# Patient Record
Sex: Male | Born: 1975 | Race: Black or African American | Hispanic: No | Marital: Single | Smoking: Never smoker
Health system: Southern US, Community
[De-identification: ages and names within clinical notes are randomized; demographics above are authoritative.]

---

## 2018-04-11 ENCOUNTER — Emergency Department (HOSPITAL_COMMUNITY)
Admission: EM | Admit: 2018-04-11 | Discharge: 2018-04-11 | Disposition: A | Discharge status: Home / Self Care | Payer: Self-pay | Attending: Emergency Medicine | Admitting: Emergency Medicine

## 2018-04-11 ENCOUNTER — Encounter (HOSPITAL_COMMUNITY): Payer: Self-pay | Admitting: Emergency Medicine

## 2018-04-11 DIAGNOSIS — R42 Dizziness and giddiness: Secondary | ICD-10-CM | POA: Insufficient documentation

## 2018-04-11 DIAGNOSIS — R55 Syncope and collapse: Secondary | ICD-10-CM

## 2018-04-11 LAB — CBC
HEMATOCRIT: 44.7 % (ref 39.0–52.0)
Hemoglobin: 15.5 g/dL (ref 13.0–17.0)
MCH: 31.8 pg (ref 26.0–34.0)
MCHC: 34.7 g/dL (ref 30.0–36.0)
MCV: 91.6 fL (ref 78.0–100.0)
Platelets: 302 10*3/uL (ref 150–400)
RBC: 4.88 MIL/uL (ref 4.22–5.81)
RDW: 12.2 % (ref 11.5–15.5)
WBC: 5.9 10*3/uL (ref 4.0–10.5)

## 2018-04-11 LAB — BASIC METABOLIC PANEL
Anion gap: 8 (ref 5–15)
BUN: 14 mg/dL (ref 6–20)
CHLORIDE: 108 mmol/L (ref 98–111)
CO2: 28 mmol/L (ref 22–32)
Calcium: 9.8 mg/dL (ref 8.9–10.3)
Creatinine, Ser: 1.17 mg/dL (ref 0.61–1.24)
GFR calc Af Amer: >60 mL/min (ref >60)
GFR calc non Af Amer: >60 mL/min (ref >60)
GLUCOSE: 78 mg/dL (ref 70–99)
Potassium: 3.8 mmol/L (ref 3.5–5.1)
Sodium: 144 mmol/L (ref 135–145)

## 2018-04-11 LAB — URINALYSIS, ROUTINE W REFLEX MICROSCOPIC
Bilirubin Urine: NEGATIVE
GLUCOSE, UA: NEGATIVE mg/dL
Hgb urine dipstick: NEGATIVE
KETONES UR: NEGATIVE mg/dL
Leukocytes, UA: NEGATIVE
Nitrite: NEGATIVE
PH: 6 (ref 5.0–8.0)
Protein, ur: NEGATIVE mg/dL
Specific Gravity, Urine: 1.027 (ref 1.005–1.030)

## 2018-04-11 LAB — CBG MONITORING, ED: GLUCOSE-CAPILLARY: 79 mg/dL (ref 70–99)

## 2018-04-11 NOTE — ED Triage Notes (Signed)
Patient here from work with complaints of sudden onset of dizziness when standing. Felt nausea "like I was going to faint". Better when sitting.

## 2018-04-11 NOTE — ED Notes (Signed)
Bed: WA03 Expected date:  Expected time:  Means of arrival:  Comments: 

## 2018-04-11 NOTE — ED Provider Notes (Signed)
[facility]-EMERGENCY DEPT Provider Note   CSN: [id number] Arrival date & time: 04/11/18  1407     History   Chief Complaint Chief Complaint  Patient presents with  . Dizziness    HPI Luis Sandoval is a 42 y.o. male.  The history is provided by the patient. No language interpreter was used.     42 year old male presenting for evaluation of dizziness.  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  He is 2 weeks into the job.  This morning after staying at his job for a few hours, he felt lightheadedness and weak and request for his boss to dismiss him.  He mentions sitting down seems to improve his symptoms and his fatigue has since improved.  He describes dizziness more of some lightheadedness.  He admits he has been feeling well leading to his shift.  He has not ate all day.  He feels that he did not drink enough fluid.  He denies any recent sickness.  He did complain of some mild headache earlier but that has since improved.  He denies any hearing changes and denies any focal numbness or weakness.  No urinary symptoms  History reviewed. No pertinent past medical history.  There are no active problems to display for this patient.   History reviewed. No pertinent surgical history.      Home Medications    Prior to Admission medications   Not on File    Family History No family history on file.  Social History Social History   Tobacco Use  . Smoking status: Never Smoker  . Smokeless tobacco: Never Used  Substance Use Topics  . Alcohol use: Never    Frequency: Never  . Drug use: Never     Allergies   Patient has no allergy information on record.   Review of Systems Review of Systems  All other systems reviewed and are negative.    Physical Exam Updated Vital Signs BP (!) 147/82 (BP Location: Right Arm)   Pulse 92   Temp 97.9 F (36.6 C) (Oral)   Resp 16   SpO2 100%    Physical Exam  Constitutional: He is oriented to person, place, and time. He appears well-developed and well-nourished. No distress.  HENT:  Head: Atraumatic.  Mouth/Throat: Oropharynx is clear and moist.  Ears: TMs normal bilaterally, no evidence of cerumen impaction.  Eyes: Pupils are equal, round, and reactive to light. Conjunctivae and EOM are normal.  Neck: Normal range of motion. Neck supple.  Cardiovascular: Normal rate and regular rhythm.  Pulmonary/Chest: Effort normal and breath sounds normal.  Abdominal: Soft. Bowel sounds are normal.  Neurological: He is alert and oriented to person, place, and time. He has normal strength. No cranial nerve deficit or sensory deficit. He displays a negative Romberg sign. Coordination normal. GCS eye subscore is 4. GCS verbal subscore is 5. GCS motor subscore is 6.  Skin: No rash noted.  Psychiatric: He has a normal mood and affect.  Nursing note and vitals reviewed.    ED Treatments / Results  Labs (all labs ordered are listed, but only abnormal results are displayed) Labs Reviewed  BASIC METABOLIC PANEL  CBC  URINALYSIS, ROUTINE W REFLEX MICROSCOPIC  CBG MONITORING, ED    EKG None   Date: 04/11/2018  Rate: 63  Rhythm: normal sinus rhythm  QRS Axis: normal  Intervals: normal  ST/T Wave abnormalities: normal  Conduction Disutrbances: none  Narrative Interpretation:   Old EKG Reviewed: No significant changes noted     Radiology No results found.  Procedures Procedures (including critical care time)  Medications Ordered in ED Medications - No data to display   Initial Impression / Assessment and Plan / ED Course  I have reviewed the triage vital signs and the nursing notes.  Pertinent labs & imaging results that were available during my care of the patient were reviewed by me and considered in my medical decision making (see chart for details).     BP (!) 147/82 (BP Location: Right Arm)   Pulse 92   Temp 97.9  F (36.6 C) (Oral)   Resp 16   SpO2 100%    Final Clinical Impressions(s) / ED Diagnoses   Final diagnoses:  Near syncope    ED Discharge Orders    None     8:29 PM Patient presents complaining of dizziness/lightheadedness causing him to miss his shift today.  He is back to his baseline.  He has no focal neuro deficit on exam and is well-appearing.  Vital signs stable.  Labs are reassuring, urinalysis unremarkable, electro lites panels are reassuring, normal renal function, normal H&H and normal WBC.  EKG without concerning arrhythmia or ischemic changes.  Patient requesting for work note.  Work note provided.   Fayrene Helper, PA-C 04/11/18 2033    Pricilla Loveless, MD 04/12/18 [phone]

## 2018-04-11 NOTE — Discharge Instructions (Signed)
Please stay hydrated and rest.  Follow up with your doctor for further care.

## 2018-05-08 ENCOUNTER — Emergency Department (HOSPITAL_COMMUNITY): Payer: Self-pay

## 2018-05-08 ENCOUNTER — Inpatient Hospital Stay (HOSPITAL_COMMUNITY)
Admission: EM | Admit: 2018-05-08 | Discharge: 2018-05-09 | DRG: 040 | Disposition: A | Discharge status: Home / Self Care | Payer: Self-pay | Attending: Pulmonary Disease | Admitting: Pulmonary Disease

## 2018-05-08 DIAGNOSIS — J9601 Acute respiratory failure with hypoxia: Secondary | ICD-10-CM | POA: Insufficient documentation

## 2018-05-08 DIAGNOSIS — Z4659 Encounter for fitting and adjustment of other gastrointestinal appliance and device: Secondary | ICD-10-CM | POA: Insufficient documentation

## 2018-05-08 DIAGNOSIS — G9340 Encephalopathy, unspecified: Secondary | ICD-10-CM | POA: Diagnosis present

## 2018-05-08 DIAGNOSIS — R402433 Glasgow coma scale score 3-8, at hospital admission: Secondary | ICD-10-CM | POA: Diagnosis present

## 2018-05-08 DIAGNOSIS — Z978 Presence of other specified devices: Secondary | ICD-10-CM

## 2018-05-08 DIAGNOSIS — Z781 Physical restraint status: Secondary | ICD-10-CM

## 2018-05-08 DIAGNOSIS — E872 Acidosis, unspecified: Secondary | ICD-10-CM

## 2018-05-08 DIAGNOSIS — S80212A Abrasion, left knee, initial encounter: Secondary | ICD-10-CM | POA: Diagnosis present

## 2018-05-08 DIAGNOSIS — S61210A Laceration without foreign body of right index finger without damage to nail, initial encounter: Secondary | ICD-10-CM | POA: Diagnosis present

## 2018-05-08 DIAGNOSIS — G9341 Metabolic encephalopathy: Principal | ICD-10-CM | POA: Diagnosis present

## 2018-05-08 DIAGNOSIS — R410 Disorientation, unspecified: Secondary | ICD-10-CM

## 2018-05-08 DIAGNOSIS — S41111A Laceration without foreign body of right upper arm, initial encounter: Secondary | ICD-10-CM | POA: Diagnosis present

## 2018-05-08 DIAGNOSIS — W25XXXA Contact with sharp glass, initial encounter: Secondary | ICD-10-CM | POA: Diagnosis present

## 2018-05-08 DIAGNOSIS — F14121 Cocaine abuse with intoxication with delirium: Secondary | ICD-10-CM | POA: Diagnosis present

## 2018-05-08 DIAGNOSIS — R4689 Other symptoms and signs involving appearance and behavior: Secondary | ICD-10-CM

## 2018-05-08 DIAGNOSIS — S80211A Abrasion, right knee, initial encounter: Secondary | ICD-10-CM | POA: Diagnosis present

## 2018-05-08 DIAGNOSIS — S0181XA Laceration without foreign body of other part of head, initial encounter: Secondary | ICD-10-CM | POA: Diagnosis present

## 2018-05-08 DIAGNOSIS — E861 Hypovolemia: Secondary | ICD-10-CM | POA: Diagnosis present

## 2018-05-08 DIAGNOSIS — N179 Acute kidney failure, unspecified: Secondary | ICD-10-CM | POA: Diagnosis present

## 2018-05-08 LAB — BASIC METABOLIC PANEL
ANION GAP: 6 (ref 5–15)
ANION GAP: 9 (ref 5–15)
BUN: 23 mg/dL — AB (ref 6–20)
BUN: 23 mg/dL — AB (ref 6–20)
CHLORIDE: 108 mmol/L (ref 98–111)
CO2: 22 mmol/L (ref 22–32)
CO2: 23 mmol/L (ref 22–32)
Calcium: 8.5 mg/dL — ABNORMAL LOW (ref 8.9–10.3)
Calcium: 8.8 mg/dL — ABNORMAL LOW (ref 8.9–10.3)
Chloride: 109 mmol/L (ref 98–111)
Creatinine, Ser: 1.57 mg/dL — ABNORMAL HIGH (ref 0.61–1.24)
Creatinine, Ser: 1.8 mg/dL — ABNORMAL HIGH (ref 0.61–1.24)
GFR calc Af Amer: >60 mL/min (ref >60)
GFR calc Af Amer: 52 mL/min — ABNORMAL LOW (ref >60)
GFR calc non Af Amer: 53 mL/min — ABNORMAL LOW (ref >60)
GFR calc non Af Amer: 45 mL/min — ABNORMAL LOW (ref >60)
GLUCOSE: 113 mg/dL — AB (ref 70–99)
GLUCOSE: 94 mg/dL (ref 70–99)
POTASSIUM: 5 mmol/L (ref 3.5–5.1)
POTASSIUM: 4.6 mmol/L (ref 3.5–5.1)
SODIUM: 137 mmol/L (ref 135–145)
Sodium: 140 mmol/L (ref 135–145)

## 2018-05-08 LAB — COMPREHENSIVE METABOLIC PANEL
ALT: 19 U/L (ref 0–44)
AST: 35 U/L (ref 15–41)
Albumin: 3.7 g/dL (ref 3.5–5.0)
Alkaline Phosphatase: 43 U/L (ref 38–126)
Anion gap: 11 (ref 5–15)
BUN: 24 mg/dL — ABNORMAL HIGH (ref 6–20)
CO2: 19 mmol/L — AB (ref 22–32)
Calcium: 8.3 mg/dL — ABNORMAL LOW (ref 8.9–10.3)
Chloride: 108 mmol/L (ref 98–111)
Creatinine, Ser: 1.66 mg/dL — ABNORMAL HIGH (ref 0.61–1.24)
GFR calc Af Amer: 58 mL/min — ABNORMAL LOW (ref >60)
GFR calc non Af Amer: 50 mL/min — ABNORMAL LOW (ref >60)
Glucose, Bld: 116 mg/dL — ABNORMAL HIGH (ref 70–99)
POTASSIUM: 4.7 mmol/L (ref 3.5–5.1)
SODIUM: 138 mmol/L (ref 135–145)
Total Bilirubin: 0.9 mg/dL (ref 0.3–1.2)
Total Protein: 5.9 g/dL — ABNORMAL LOW (ref 6.5–8.1)

## 2018-05-08 LAB — I-STAT ARTERIAL BLOOD GAS, ED
ACID-BASE DEFICIT: 15 mmol/L — AB (ref 0.0–2.0)
Acid-base deficit: 5 mmol/L — ABNORMAL HIGH (ref 0.0–2.0)
BICARBONATE: 22.3 mmol/L (ref 20.0–28.0)
Bicarbonate: 14.5 mmol/L — ABNORMAL LOW (ref 20.0–28.0)
O2 SAT: 95 %
O2 SAT: 100 %
PCO2 ART: 46.6 mmHg (ref 32.0–48.0)
PH ART: 7.082 — AB (ref 7.350–7.450)
PO2 ART: 102 mmHg (ref 83.0–108.0)
PO2 ART: 271 mmHg — AB (ref 83.0–108.0)
Patient temperature: 98.9
Patient temperature: 98.4
TCO2: 16 mmol/L — AB (ref 22–32)
TCO2: 24 mmol/L (ref 22–32)
pCO2 arterial: 48.7 mmHg — ABNORMAL HIGH (ref 32.0–48.0)
pH, Arterial: 7.286 — ABNORMAL LOW (ref 7.350–7.450)

## 2018-05-08 LAB — ACETAMINOPHEN LEVEL

## 2018-05-08 LAB — CBC WITH DIFFERENTIAL/PLATELET
Abs Immature Granulocytes: 0.3 10*3/uL — ABNORMAL HIGH (ref 0.0–0.1)
Basophils Absolute: 0.1 10*3/uL (ref 0.0–0.1)
Basophils Relative: 1 %
Eosinophils Absolute: 0 10*3/uL (ref 0.0–0.7)
Eosinophils Relative: 0 %
HCT: 45.6 % (ref 39.0–52.0)
HEMOGLOBIN: 15 g/dL (ref 13.0–17.0)
Immature Granulocytes: 2 %
LYMPHS PCT: 4 %
Lymphs Abs: 0.6 10*3/uL — ABNORMAL LOW (ref 0.7–4.0)
MCH: 31.1 pg (ref 26.0–34.0)
MCHC: 32.9 g/dL (ref 30.0–36.0)
MCV: 94.4 fL (ref 78.0–100.0)
MONOS PCT: 7 %
Monocytes Absolute: 1.2 10*3/uL — ABNORMAL HIGH (ref 0.1–1.0)
Neutro Abs: 14.2 10*3/uL — ABNORMAL HIGH (ref 1.7–7.7)
Neutrophils Relative %: 86 %
Platelets: 237 10*3/uL (ref 150–400)
RBC: 4.83 MIL/uL (ref 4.22–5.81)
RDW: 11.8 % (ref 11.5–15.5)
WBC: 16.4 10*3/uL — ABNORMAL HIGH (ref 4.0–10.5)

## 2018-05-08 LAB — URINALYSIS, COMPLETE (UACMP) WITH MICROSCOPIC
BILIRUBIN URINE: NEGATIVE
GLUCOSE, UA: NEGATIVE mg/dL
Ketones, ur: 20 mg/dL — AB
Leukocytes, UA: NEGATIVE
Nitrite: NEGATIVE
Protein, ur: 30 mg/dL — AB
SPECIFIC GRAVITY, URINE: 1.013 (ref 1.005–1.030)
pH: 5 (ref 5.0–8.0)

## 2018-05-08 LAB — RAPID URINE DRUG SCREEN, HOSP PERFORMED
Amphetamines: NOT DETECTED
BARBITURATES: NOT DETECTED
Benzodiazepines: POSITIVE — AB
Cocaine: POSITIVE — AB
OPIATES: NOT DETECTED
TETRAHYDROCANNABINOL: POSITIVE — AB

## 2018-05-08 LAB — PROTIME-INR
INR: 1.2
PROTHROMBIN TIME: 15.1 s (ref 11.4–15.2)

## 2018-05-08 LAB — CK: CK TOTAL: 443 U/L — AB (ref 49–397)

## 2018-05-08 LAB — MRSA PCR SCREENING: MRSA by PCR: NEGATIVE

## 2018-05-08 LAB — HIV ANTIBODY (ROUTINE TESTING W REFLEX): HIV Screen 4th Generation wRfx: NONREACTIVE

## 2018-05-08 LAB — AMMONIA: Ammonia: 53 umol/L — ABNORMAL HIGH (ref 9–35)

## 2018-05-08 LAB — SALICYLATE LEVEL

## 2018-05-08 LAB — OSMOLALITY: Osmolality: 295 mOsm/kg (ref 275–295)

## 2018-05-08 LAB — ETHANOL

## 2018-05-08 LAB — GLUCOSE, CAPILLARY: Glucose-Capillary: 101 mg/dL — ABNORMAL HIGH (ref 70–99)

## 2018-05-08 MED ORDER — SODIUM CHLORIDE 0.9 % IV SOLN
INTRAVENOUS | Status: DC
  Administered 2018-05-09: 05:00:00 via INTRAVENOUS

## 2018-05-08 MED ORDER — SODIUM CHLORIDE 0.9 % IV BOLUS: 1000.0000 mL | Freq: Once | INTRAVENOUS | Status: DC

## 2018-05-08 MED ORDER — LIDOCAINE HCL (PF) 1 % IJ SOLN
5.0000 mL | Freq: Once | INTRAMUSCULAR | Status: AC
  Administered 2018-05-08: 5 mL
  Filled 2018-05-08: qty 5

## 2018-05-08 MED ORDER — FENTANYL CITRATE (PF) 100 MCG/2ML IJ SOLN
INTRAMUSCULAR | Status: AC
  Filled 2018-05-08: qty 2

## 2018-05-08 MED ORDER — FAMOTIDINE IN NACL 20-0.9 MG/50ML-% IV SOLN
20.0000 mg | Freq: Two times a day (BID) | INTRAVENOUS | Status: DC
  Administered 2018-05-08 (×2): 20 mg via INTRAVENOUS
  Filled 2018-05-08 (×3): qty 50

## 2018-05-08 MED ORDER — FENTANYL CITRATE (PF) 100 MCG/2ML IJ SOLN
50.0000 ug | Freq: Once | INTRAMUSCULAR | Status: AC
  Administered 2018-05-08: 50 ug via INTRAVENOUS
  Filled 2018-05-08: qty 2

## 2018-05-08 MED ORDER — SODIUM CHLORIDE 0.9 % IV SOLN
0.5000 mg/h | INTRAVENOUS | Status: DC
  Administered 2018-05-08: 0.5 mg/h via INTRAVENOUS
  Filled 2018-05-08: qty 10

## 2018-05-08 MED ORDER — SODIUM CHLORIDE 0.9 % IV BOLUS
1000.0000 mL | Freq: Once | INTRAVENOUS | Status: AC
  Administered 2018-05-08: 1000 mL via INTRAVENOUS

## 2018-05-08 MED ORDER — FENTANYL BOLUS VIA INFUSION
50.0000 ug | INTRAVENOUS | Status: DC | PRN
  Filled 2018-05-08: qty 50

## 2018-05-08 MED ORDER — STERILE WATER FOR INJECTION IV SOLN
INTRAVENOUS | Status: DC
  Administered 2018-05-08: 05:00:00 via INTRAVENOUS
  Filled 2018-05-08: qty 850

## 2018-05-08 MED ORDER — HEPARIN SODIUM (PORCINE) 5000 UNIT/ML IJ SOLN
5000.0000 [IU] | Freq: Three times a day (TID) | INTRAMUSCULAR | Status: DC
  Administered 2018-05-08 – 2018-05-09 (×4): 5000 [IU] via SUBCUTANEOUS
  Filled 2018-05-08 (×4): qty 1

## 2018-05-08 MED ORDER — ROCURONIUM BROMIDE 50 MG/5ML IV SOLN
70.0000 mg | Freq: Once | INTRAVENOUS | Status: AC
  Administered 2018-05-08: 70 mg via INTRAVENOUS

## 2018-05-08 MED ORDER — MIDAZOLAM HCL 2 MG/2ML IJ SOLN
INTRAMUSCULAR | Status: AC
  Filled 2018-05-08: qty 2

## 2018-05-08 MED ORDER — ETOMIDATE 2 MG/ML IV SOLN
20.0000 mg | Freq: Once | INTRAVENOUS | Status: AC
  Administered 2018-05-08: 20 mg via INTRAVENOUS

## 2018-05-08 MED ORDER — WHITE PETROLATUM EX OINT
TOPICAL_OINTMENT | CUTANEOUS | Status: AC
  Administered 2018-05-08: 1
  Filled 2018-05-08: qty 28.35

## 2018-05-08 MED ORDER — TETANUS-DIPHTH-ACELL PERTUSSIS 5-2.5-18.5 LF-MCG/0.5 IM SUSP
0.5000 mL | Freq: Once | INTRAMUSCULAR | Status: AC
  Administered 2018-05-08: 0.5 mL via INTRAMUSCULAR
  Filled 2018-05-08: qty 0.5

## 2018-05-08 MED ORDER — SODIUM BICARBONATE 8.4 % IV SOLN
50.0000 meq | Freq: Once | INTRAVENOUS | Status: AC
  Administered 2018-05-08: 50 meq via INTRAVENOUS
  Filled 2018-05-08: qty 50

## 2018-05-08 MED ORDER — SODIUM CHLORIDE 0.9 % IV SOLN
INTRAVENOUS | Status: DC
  Administered 2018-05-08: 04:00:00 via INTRAVENOUS

## 2018-05-08 MED ORDER — FENTANYL 2500MCG IN NS 250ML (10MCG/ML) PREMIX INFUSION
25.0000 ug/h | INTRAVENOUS | Status: DC
  Administered 2018-05-08: 50 ug/h via INTRAVENOUS
  Filled 2018-05-08: qty 250
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
## 2018-05-08 NOTE — Progress Notes (Signed)
Self extubated earlier today.  Maintaining airway.  Oxygenation, hemodynamics stable.  Monitor in ICU.  Coralyn Helling, MD [facility] Pulmonary/Critical Care 05/08/2018, 2:13 PM
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
## 2018-05-08 NOTE — Progress Notes (Signed)
Advanced ETT from 24 to 26 per MD

## 2018-05-08 NOTE — Progress Notes (Signed)
PCCM INTERVAL PROGRESS NOTE  Much more awake now. Acting appropriately. Admits to smoking crack/cocaine last night and was hallucinating.   Will dc foley and order diet. If he remains appropriate will move him out later today.    Joneen Roach, AGACNP-BC [facility] Pulmonary/Critical Care Pager [phone] or [phone]  05/08/2018 11:30 AM

## 2018-05-08 NOTE — Progress Notes (Signed)
Patient self extubated and was placed on a NRB due to O2 sat being 80%. MD was called and RT prepared everything for reintubation.

## 2018-05-08 NOTE — H&P (Signed)
NAME:  Tiburcio Linder, MRN:  [id number], DOB:  1975-12-10, LOS: 0 ADMISSION DATE:  05/08/2018, CONSULTATION DATE:  05/08/18 REFERRING MD:  Bebe Shaggy CHIEF COMPLAINT:  AMS   Brief History   Primus Gritton is a 42 y.o. male who was brought to ED with AMS, delirium, odd behavior (punching walls, walking through glass).  He was intubated by EDP and PCCM asked to admit to ICU.  Significant Hospital Events   10/4 > admit.  Consults: date of consult/date signed off & final recs:  PCCM 10/4 > admit.  Procedures (surgical and bedside):  ETT 10/4 >   Significant Diagnostic Tests:  CT head 10/4 > no acute process. CXR 10/4 > no acute process.  Micro Data: Blood 10/4 >  Urine 10/4 >  Sputum 10/4 >   Antimicrobials:  None.   Subjective:  Comfortable on vent.  Objective   Blood pressure 121/77, pulse 82, temperature 98.4 F (36.9 C), temperature source Rectal, resp. rate (!) 0, SpO2 99 %.    Vent Mode: PRVC FiO2 (%):  [100 %] 100 % Set Rate:  [16 bmp] 16 bmp Vt Set:  [600 mL] 600 mL PEEP:  [5 cmH20] 5 cmH20 Plateau Pressure:  [18 cmH20] 18 cmH20   Intake/Output Summary (Last 24 hours) at 05/08/2018 0401 Last data filed at 05/08/2018 0335 Gross per 24 hour  Intake 2000 ml  Output -  Net 2000 ml   There were no vitals filed for this visit.  Examination: General: Young CC male, resting in bed, in NAD. Neuro: Sedated, non-responsive. HEENT: [place]/AT. Sclerae anicteric. Cardiovascular: RRR, no M/R/G.  Lungs: Respirations even and unlabored.  CTA bilaterally, No W/R/R. Abdomen: BS x 4, soft, NT/ND.  Musculoskeletal: No gross deformities, no edema.  Skin: Multiple skin abrasions throughout arms, torso, knees.  Skin otherwise warm, no rashes.  Assessment & Plan:   Acute encephalopathy - concern for drug use, CT head, Salicylate, APAP, Ethanol all normal.  UDS pending.   - F/u on UDS, ammonia. - Assess osmoles for osmolar gap.  Respiratory insufficiency - due to inability to  protect the airway in the setting of AMS. - Full vent support. - Assess ABG. - Wean as able. - Daily SBT. - Bronchial hygiene. - Follow CXR.  Respiratory + NAG metabolic acidosis. - 1 amp bicarb now then start drip. - Repeat ABG and BMP at 0900.  Sedation needs due to mechanical ventilation. - Fentanyl gtt / Midazolam gtt. - RASS goal: 0 to -1. - Daily WUA.  AKI. - Continue fluids. - Follow BMP.   Disposition / Summary of Today's Plan 05/08/18   Admit to ICU.    Diet: NPO. Pain/Anxiety/Delirium protocol (if indicated): in place. VAP protocol (if indicated): in place. DVT prophylaxis: SCD's / Heparin. GI prophylaxis: Famotidine. Hyperglycemia protocol: N/A. Mobility: Bedrest. Code Status: Full. Family Communication: None available.  Labs   CBC: No results for input(s): WBC, NEUTROABS, HGB, HCT, MCV, PLT in the last 168 hours. Basic Metabolic Panel: No results for input(s): NA, K, CL, CO2, GLUCOSE, BUN, CREATININE, CALCIUM, MG, PHOS in the last 168 hours. GFR: CrCl cannot be calculated (Patient's most recent lab result is older than the maximum 21 days allowed.). No results for input(s): PROCALCITON, WBC, LATICACIDVEN in the last 168 hours. Liver Function Tests: No results for input(s): AST, ALT, ALKPHOS, BILITOT, PROT, ALBUMIN in the last 168 hours. No results for input(s): LIPASE, AMYLASE in the last 168 hours. No results for input(s): AMMONIA in the last 168 hours.  ABG    Component Value Date/Time   PHART 7.082 (LL) 05/08/2018 0312   PCO2ART 48.7 (H) 05/08/2018 0312   PO2ART 102.0 05/08/2018 0312   HCO3 14.5 (L) 05/08/2018 0312   TCO2 16 (L) 05/08/2018 0312   ACIDBASEDEF 15.0 (H) 05/08/2018 0312   O2SAT 95.0 05/08/2018 0312    Coagulation Profile: No results for input(s): INR, PROTIME in the last 168 hours. Cardiac Enzymes: No results for input(s): CKTOTAL, CKMB, CKMBINDEX, TROPONINI in the last 168 hours. HbA1C: No results found for: HGBA1C CBG: No  results for input(s): GLUCAP in the last 168 hours.  Admitting History of Present Illness.   Pt is encephelopathic; therefore, this HPI is obtained from chart review. Daniyal Tabor is a 42 y.o. male who has no known PMH as outlined below.  He presented to [facility] ED on 10/4 handcuffed via EMS and [employer] due to aggressive behavior.  He was found confused, disoriented, punching walls.  He apparently went through glass and sustained multiple abrasions to hands, arms, legs, and one laceration to right biceps.   He received 400mg  IM ketamine via EMS and upon arrival to ED, was intubated by EDP and PCCM was called for admission.  Review of Systems:   Unable to obtain as pt is encephalopathic.  Past medical history  He,  has no past medical history on file.   Surgical History   No past surgical history on file.   Social History   Social History   Socioeconomic History  . Marital status: Single    Spouse name: Not on file  . Number of children: Not on file  . Years of education: Not on file  . Highest education level: Not on file  Occupational History  . Not on file  Social Needs  . Financial resource strain: Not on file  . Food insecurity:    Worry: Not on file    Inability: Not on file  . Transportation needs:    Medical: Not on file    Non-medical: Not on file  Tobacco Use  . Smoking status: Never Smoker  . Smokeless tobacco: Never Used  Substance and Sexual Activity  . Alcohol use: Never    Frequency: Never  . Drug use: Never  . Sexual activity: Not on file  Lifestyle  . Physical activity:    Days per week: Not on file    Minutes per session: Not on file  . Stress: Not on file  Relationships  . Social connections:    Talks on phone: Not on file    Gets together: Not on file    Attends religious service: Not on file    Active member of club or organization: Not on file    Attends meetings of clubs or organizations: Not on file    Relationship status: Not on file  .  Intimate partner violence:    Fear of current or ex partner: Not on file    Emotionally abused: Not on file    Physically abused: Not on file    Forced sexual activity: Not on file  Other Topics Concern  . Not on file  Social History Narrative  . Not on file  ,  reports that he has never smoked. He has never used smokeless tobacco. He reports that he does not drink alcohol or use drugs.   Family history   His family history is not on file.   Allergies Not on File  Home meds  Prior to Admission medications  Not on File    CC time: 30 min.  Rutherford Guys, [place] - [facility] Pager: [phone]  or [phone] 05/08/2018, 4:01 AM

## 2018-05-08 NOTE — ED Notes (Signed)
Family at bedside. 

## 2018-05-08 NOTE — Progress Notes (Signed)
eLink Physician-Brief Progress Note Patient Name: Crockett Rallo DOB: 01/06/1976 MRN: [id number]   Date of Service  05/08/2018  HPI/Events of Note  Agitation - Request for restraint orders.   eICU Interventions  Will order soft bilateral wrist restraints.      Intervention Category Minor Interventions: Agitation / anxiety - evaluation and management  Issaiah Seabrooks Eugene 05/08/2018, 6:28 AM

## 2018-05-08 NOTE — Progress Notes (Signed)
Transported pt to CT with RN without any complications

## 2018-05-08 NOTE — ED Notes (Signed)
Attempted to call report

## 2018-05-08 NOTE — Progress Notes (Signed)
Patient self extubated this am while NT was attempting to obtain temperature. When awakened, pt immediately raised up in bed and was able to remove tube while in restraints. This RN was immediately called to bedside. RT and MD were called. Sedation turned off. Pt orally suctioned and encouraged to cough and take deep breaths. NRB mask was applied to patient during this time. SpO2 sats went from 80% to 100% within minutes. Pt was breathing 12-16 bpm. [place] was applied to pt at 4L and titrated to 2L, sats 99-100%. Pt responsive to pain but very drowsy. Dr. Molli Knock came to examine pt and decided not to reintubate due to pt being able to maintain airway. Pt resting at this time on 2L [place], SpO2 99% RR averaging 16 bpm.

## 2018-05-08 NOTE — Progress Notes (Signed)
175 ml Fentanyl and 50 ml Versed wasted with Dwyane Luo, RN at this time.

## 2018-05-08 NOTE — ED Notes (Signed)
Patient transported to CT scan . 

## 2018-05-09 ENCOUNTER — Telehealth: Payer: Self-pay | Admitting: Pulmonary Disease

## 2018-05-09 ENCOUNTER — Other Ambulatory Visit: Payer: Self-pay

## 2018-05-09 ENCOUNTER — Inpatient Hospital Stay (HOSPITAL_COMMUNITY): Payer: Self-pay

## 2018-05-09 ENCOUNTER — Encounter (HOSPITAL_COMMUNITY): Payer: Self-pay

## 2018-05-09 DIAGNOSIS — G9340 Encephalopathy, unspecified: Secondary | ICD-10-CM

## 2018-05-09 LAB — URINE CULTURE: Culture: NO GROWTH

## 2018-05-09 LAB — BASIC METABOLIC PANEL
Anion gap: 7 (ref 5–15)
BUN: 16 mg/dL (ref 6–20)
CO2: 24 mmol/L (ref 22–32)
Calcium: 8.5 mg/dL — ABNORMAL LOW (ref 8.9–10.3)
Chloride: 108 mmol/L (ref 98–111)
Creatinine, Ser: 1.58 mg/dL — ABNORMAL HIGH (ref 0.61–1.24)
GFR calc Af Amer: >60 mL/min (ref >60)
GFR calc non Af Amer: 53 mL/min — ABNORMAL LOW (ref >60)
Glucose, Bld: 126 mg/dL — ABNORMAL HIGH (ref 70–99)
Potassium: 3.6 mmol/L (ref 3.5–5.1)
SODIUM: 139 mmol/L (ref 135–145)

## 2018-05-09 LAB — CBC
HCT: 42.7 % (ref 39.0–52.0)
Hemoglobin: 14 g/dL (ref 13.0–17.0)
MCH: 30.8 pg (ref 26.0–34.0)
MCHC: 32.8 g/dL (ref 30.0–36.0)
MCV: 93.8 fL (ref 78.0–100.0)
PLATELETS: 197 10*3/uL (ref 150–400)
RBC: 4.55 MIL/uL (ref 4.22–5.81)
RDW: 12.1 % (ref 11.5–15.5)
WBC: 10.4 10*3/uL (ref 4.0–10.5)

## 2018-05-09 LAB — PHOSPHORUS: PHOSPHORUS: 2.3 mg/dL — AB (ref 2.5–4.6)

## 2018-05-09 LAB — MAGNESIUM: Magnesium: 2.3 mg/dL (ref 1.7–2.4)

## 2018-05-09 MED ORDER — POTASSIUM CHLORIDE CRYS ER 20 MEQ PO TBCR
20.0000 meq | EXTENDED_RELEASE_TABLET | ORAL | Status: DC
  Administered 2018-05-09: 20 meq via ORAL
  Filled 2018-05-09 (×2): qty 1

## 2018-05-09 MED ORDER — WHITE PETROLATUM EX OINT
TOPICAL_OINTMENT | CUTANEOUS | Status: AC
  Filled 2018-05-09: qty 28.35

## 2018-05-09 NOTE — Progress Notes (Signed)
Patient to discharge home today "before 12p or I'm leaving anyway". CSW paged as patient has expressed interest in a detox/substance abuse program; awaiting response.

## 2018-05-09 NOTE — Discharge Instructions (Signed)
Contact [facility] on Monday, 05/11/18 to arrange for appointment in 10 days to have sutures removed.  Office contact number is [phone].

## 2018-05-09 NOTE — Progress Notes (Signed)
[facility] ADULT ICU REPLACEMENT PROTOCOL FOR AM LAB REPLACEMENT ONLY  The patient does apply for the [facility] Adult ICU Electrolyte Replacment Protocol based on the criteria listed below:   1. Is GFR >/= 40 ml/min? Yes.    Patient's GFR today is 60 2. Is urine output >/= 0.5 ml/kg/hr for the last 6 hours? Yes.   Patient's UOP is 1.3 ml/kg/hr 3. Is BUN < 60 mg/dL? Yes.    Patient's BUN today is 16 4. Abnormal electrolyte(s K-3.6 5. Ordered repletion with: per protocol 6. If a panic level lab has been reported, has the CCM MD in charge been notified? Yes.  .   Physician:  Dr Darrick Penna  Luis Sandoval, Luis Sandoval 05/09/2018 5:42 AM

## 2018-05-09 NOTE — Plan of Care (Signed)

## 2018-05-09 NOTE — Discharge Summary (Signed)
Name: Luis Sandoval Medical record number: [id number] Date of birth: Apr 11, 1976 Admission date: 05/08/2018 Discharged date: 05/09/2018  Discharge diagnoses: Acute metabolic encephalopathy Acute hypoxic respiratory failure with compromised airway Cocaine intoxication Acute renal failure due to hypovolemia  Hospital summary: 42 yo male brought to the ER by [employer] and EMS.  He was agitated, combative and punching walls.  Ran through glass and had multiple lacerations on right hand and right bicep.  Given ketamine by EMS and then required intubation.  Self extubated in AM of 10/04.  Vital signs: BP 112/85   Pulse 70   Temp 98.5 F (36.9 C) (Oral)   Resp 18   Wt 95.4 kg   SpO2 97%   Physical exam: General - alert Eyes - pupils reactive ENT - no sinus tenderness, no stridor Cardiac - regular rate/rhythm, no murmur Chest - equal breath sounds b/l, no wheezing or rales Abdomen - soft, non tender, + bowel sounds Extremities - dressing on Rt hand and arm Skin - no rashes Lymphatics - no lymphadenopathy Neuro - CN intact, normal strength, moves extremities, follows commands Psych - normal mood and behavior  Tests: CMP Latest Ref Rng & Units 05/09/2018 05/08/2018 05/08/2018  Glucose 70 - 99 mg/dL 440(N) 94 027(O)  BUN 6 - 20 mg/dL 16 53(G) 64(Q)  Creatinine 0.61 - 1.24 mg/dL 0.34(V) 4.25(Z) 5.63(O)  Sodium 135 - 145 mmol/L 139 140 137  Potassium 3.5 - 5.1 mmol/L 3.6 4.6 5.0  Chloride 98 - 111 mmol/L 108 108 109  CO2 22 - 32 mmol/L 24 23 22   Calcium 8.9 - 10.3 mg/dL 7.5(I) 4.3(P) 2.9(J)  Total Protein 6.5 - 8.1 g/dL - - -  Total Bilirubin 0.3 - 1.2 mg/dL - - -  Alkaline Phos 38 - 126 U/L - - -  AST 15 - 41 U/L - - -  ALT 0 - 44 U/L - - -    CBC Latest Ref Rng & Units 05/09/2018 05/08/2018 04/11/2018  WBC 4.0 - 10.5 K/uL 10.4 16.4(H) 5.9  Hemoglobin 13.0 - 17.0 g/dL 18.8 41.6 60.6  Hematocrit 39.0 - 52.0 % 42.7 45.6 44.7  Platelets 150 - 400 K/uL 197 237 302    CBG (last 3)  Recent  Labs    05/08/18 0727  GLUCAP 101*    ABG    Component Value Date/Time   PHART 7.286 (L) 05/08/2018 0423   PCO2ART 46.6 05/08/2018 0423   PO2ART 271.0 (H) 05/08/2018 0423   HCO3 22.3 05/08/2018 0423   TCO2 24 05/08/2018 0423   ACIDBASEDEF 5.0 (H) 05/08/2018 0423   O2SAT 100.0 05/08/2018 0423    Discharge medications: None  Diet: Regular  Activity: Return to normal activity as tolerated  Follow up: - Will have our office call him on 05/11/18 to arrange for follow up to have sutures removed in 10 days. - social worker to recommend options for drug rehabilitation programs   Time spent with discharge planning 37 minutes  Coralyn Helling, MD [facility] 05/09/2018, 9:11 AM

## 2018-05-09 NOTE — Telephone Encounter (Signed)
Pt to be discharged on 05/09/18.  Needs one time office f/u with NP to have sutures removed 10 days after discharge.  Please call him on 05/11/18 to arrange for HFU visit.

## 2018-05-09 NOTE — Progress Notes (Signed)
Patient discharges to home at this time. Friend providing transport. Plan to voluntarily go to [facility] this evening.

## 2018-05-09 NOTE — Progress Notes (Signed)
Patient uninsured, CC44 does not apply, updated bedside RN.

## 2018-05-11 NOTE — Telephone Encounter (Signed)
LMTCB. Called and spoke to patient sister, she states she has been unable to contact him as well. He was supposed to go to [place] where she is and he never showed. Will hold in triage and attempt to call back another time.

## 2018-05-11 NOTE — Telephone Encounter (Signed)
Attempted to call pt. I did not receive an answer. I have left a message for pt to return our call.  

## 2018-05-12 NOTE — Telephone Encounter (Signed)
Called patient unable to reach left message to give us a call back.

## 2018-05-13 LAB — CULTURE, BLOOD (ROUTINE X 2)
CULTURE: NO GROWTH
Culture: NO GROWTH
SPECIAL REQUESTS: ADEQUATE
SPECIAL REQUESTS: ADEQUATE

## 2018-05-17 ENCOUNTER — Other Ambulatory Visit: Payer: Self-pay

## 2018-05-17 ENCOUNTER — Emergency Department (HOSPITAL_COMMUNITY): Payer: Self-pay

## 2018-05-17 ENCOUNTER — Encounter (HOSPITAL_COMMUNITY): Payer: Self-pay | Admitting: *Deleted

## 2018-05-17 ENCOUNTER — Emergency Department (HOSPITAL_COMMUNITY)
Admission: EM | Admit: 2018-05-17 | Discharge: 2018-05-17 | Disposition: A | Discharge status: Home / Self Care | Payer: Self-pay | Attending: Emergency Medicine | Admitting: Emergency Medicine

## 2018-05-17 DIAGNOSIS — Z4802 Encounter for removal of sutures: Secondary | ICD-10-CM

## 2018-05-17 DIAGNOSIS — Z4800 Encounter for change or removal of nonsurgical wound dressing: Secondary | ICD-10-CM | POA: Insufficient documentation

## 2018-05-17 MED ORDER — BACITRACIN ZINC 500 UNIT/GM EX OINT
TOPICAL_OINTMENT | Freq: Two times a day (BID) | CUTANEOUS | Status: DC
  Administered 2018-05-17: 1 via TOPICAL
  Filled 2018-05-17: qty 4.5

## 2018-05-17 NOTE — Discharge Instructions (Addendum)
Please return to the Emergency Department for any new or worsening symptoms or if your symptoms do not improve. Please be sure to follow up with your Primary Care Physician as soon as possible regarding your visit today. If you do not have a Primary Doctor please use the resources below to establish one. Please keep the areas clean and dry and covered with sterile dressings.  Contact a health care provider if: You have increasing redness, swelling, or pain in the wound. You see pus coming from the wound. You have a fever. You notice a bad smell coming from the wound or dressing. Your wound breaks open (edges not staying together).  Do not take your medicine if  develop an itchy rash, swelling in your mouth or lips, or difficulty breathing.   RESOURCE GUIDE  Chronic Pain Problems: Contact [facility]  [phone] Patients need to be referred by their primary care doctor.  Insufficient Money for Medicine: Contact United Way:  call "211" or [facility] (918)110-7814.  No Primary Care Doctor: Call [facility] Connect  [phone] - can help you locate a primary care doctor that  accepts your insurance, provides certain services, etc. Physician Referral Service- [phone]  Agencies that provide inexpensive medical care: [facility]  [phone] [facility]  [phone] [facility]  [phone] [facility]  [phone] Planned Parenthood  [phone] [facility]  [phone]  Medicaid-accepting [facility] Providers: [facility]- [address]  [phone], Mon-Fri 9am-7pm, Sat 9am-1pm [facility]- [address] [place], Suite [place]  [phone] [facility]- [address] [place]  [phone] [facility]- [address]  [phone] Renaye Rakers- [address] [place], [address], [phone]  Only accepts [place]  patients after they have their name  applied to their card  Self Pay (no insurance) in [facility]: Sickle Cell Patients: Dr Willey Blade, [facility] Internal Medicine  [address] [place], [phone] [facility] Urgent Care- [address] [place]  [phone]       [facility] [place]- [address]       -     [facility]- see information above (Speak to [employer] if you do not have insurance)       -  [facility]- [address] [place], [phone]       -  [facility]- [address],  [phone]       -  [facility]- [address] [phone]       -  Dr Julio Sicks-  [address], [place], [phone]       -  [facility]gent Care- [address], [phone]       -  [facility]- [address], [phone], also [address] [phone]       -    [facility]- [address] [place], [phone], 1st & 3rd Saturday   every month, 10am-1pm  1) Find a Doctor and Pay Out of Pocket Although you won't have to find out who is covered by your insurance plan, it is a good idea to ask around and get recommendations. You will then need to call the office and see if the doctor you have chosen will accept you  as a new patient and what types of options they offer for patients who are self-pay. Some doctors offer discounts or will set up payment plans for their patients who do not have insurance, but you will need to ask so you aren't surprised when you get to your appointment.  2) Contact Your Local Health Department Not all health departments have doctors that can see patients for sick visits, but many do, so it is worth a call to see if yours does. If you don't know where your local health department is, you can check in your phone book. The CDC also has a tool to help you locate your state's health department, and many state websites also have listings of all of their local health departments.  3)  Find a Walk-in Clinic If your illness is not likely to be very severe or complicated, you may want to try a walk in clinic. These are popping up all over the country in pharmacies, drugstores, and shopping centers. They're usually staffed by nurse practitioners or physician assistants that have been trained to treat common illnesses and complaints. They're usually fairly quick and inexpensive. However, if you have serious medical issues or chronic medical problems, these are probably not your best option  STD Testing [facility] Department of [facility] [place], STD Clinic, [address] [place], phone [phone] or [phone].  Monday - Friday, call for an appointment. [facility] Department of [employer], STD Clinic, [place] [address], [place], phone [phone] or [phone].  Monday - Friday, call for an appointment.  Abuse/Neglect: [facility] Child Abuse Hotline ([phone] [facility] Child Abuse Hotline 220-308-3031 (After Hours)  Emergency Shelter:  [facility] 906-220-7144  Maternity Homes: Room at the [place] [facility] [phone] [facility] 6055070447  MRSA Hotline #:   8653775838  [facility] Resources  [facility]  United Way [facility] Dept. [address]                 [address]         [address] [place] [address]  Blondell Reveal Phone:  [phone]                                  Phone:  [phone]                   Phone:  [phone]  [facility], [phone] [facility] [place]- [phone]       -     [facility] in [place], [address],                                  [phone], [facility] Child Abuse Hotline 939-399-4714 or 681-238-8146  (After Hours)   Behavioral Health  Services  Substance Abuse Resources: Alcohol and Drug Services  936-703-7654 [facility] 279-405-0054 [place] (769)286-2989 Floydene Flock (213) [phone] Residential & Outpatient Substance Abuse Program  (437) [phone]  Psychological Services: [facility]  [phone] [facility]  [phone] [facility], [phone] [place]. [address], [place], ACCESS LINE: (217)[phone] or 253-690-7249, [employer].za  Dental Assistance  If unable to pay or uninsured, contact:  [facility] or [facility]. to become qualified for the adult dental clinic.  Patients with Medicaid: [facility] [phone] [address], [phone], [phone]  If unable to pay, or uninsured, contact [facility] (316)161-6563) or [facility] Department [phone] in [place], [phone] in [facility]) to become qualified for the adult dental clinic   Other Low-Cost Community Dental Services: [facility]- [address] [place], [place], [place], [address], [phone], [phone], 2nd and 4th Thursday of the month at 6:30am.  10 clients each day by appointment, can sometimes see walk-in patients if someone does not show for an appointment. [facility]- [address] [place] [address] [place], [place], [address], [phone] [facility] [address], [place], [place], [address], [phone] [facility] Health Department- [phone] [facility] Health Department- [phone] [facility] [phone]

## 2018-05-17 NOTE — ED Provider Notes (Signed)
[facility] EMERGENCY DEPARTMENT Provider Note   CSN: [id number] Arrival date & time: 05/17/18  1218     History   Chief Complaint Chief Complaint  Patient presents with  . Suture / Staple Removal  . Medical Clearance    HPI Luis Sandoval is a 42 y.o. male presenting today for suture and staple removal.  Patient was seen here on 10/4 for altered mental status and multiple injuries to his right arm after punching glass.  Patient states that his wounds have been healing well without fever, drainage or color change.  Patient denies pain to any of his wounds.  Patient states that he has been picking at certain areas to remove the sutures/staples himself.  Patient with history of homelessness and drug addiction, states that he needs sutures removed because the treatment facility would not allow him to be admitted with sutures/staples in place.  He is calm and cooperative.  Patient states that his plan is to have sutures/staples removed here in emergency department, call his brother to take him back to the facility in [facility] for admission.  HPI  History reviewed. No pertinent past medical history.  Patient Active Problem List   Diagnosis Date Noted  . Acute encephalopathy 05/08/2018  . Delirium   . Metabolic acidosis, NAG, bicarbonate losses   . Acute respiratory failure with hypoxia (HCC)   . Endotracheally intubated   . Encounter for orogastric tube placement     History reviewed. No pertinent surgical history.    Home Medications    Prior to Admission medications   Not on File    Family History History reviewed. No pertinent family history.  Social History Social History   Tobacco Use  . Smoking status: Never Smoker  . Smokeless tobacco: Never Used  Substance Use Topics  . Alcohol use: Never    Frequency: Never  . Drug use: Yes    Types: Methamphetamines     Allergies   Patient has no known allergies.   Review of  Systems Review of Systems  Constitutional: Negative.  Negative for chills and fever.  HENT: Negative.  Negative for rhinorrhea and sore throat.   Eyes: Negative.  Negative for visual disturbance.  Respiratory: Negative.  Negative for shortness of breath.   Cardiovascular: Negative.  Negative for chest pain.  Gastrointestinal: Negative.  Negative for abdominal pain, nausea and vomiting.  Genitourinary: Negative.  Negative for dysuria and hematuria.  Musculoskeletal: Negative.  Negative for arthralgias, joint swelling and myalgias.  Skin: Positive for wound. Negative for rash.  Neurological: Negative.  Negative for headaches.   Physical Exam Updated Vital Signs BP 132/90 (BP Location: Left Arm)   Pulse 80   Temp 98.5 F (36.9 C)   Resp 17   Ht 6\' 2"  (1.88 m)   Wt 93 kg   SpO2 97%   BMI 26.32 kg/m   Physical Exam  Constitutional: He appears well-developed and well-nourished. No distress.  HENT:  Head: Normocephalic and atraumatic.  Right Ear: External ear normal.  Left Ear: External ear normal.  Nose: Nose normal.  Eyes: Pupils are equal, round, and reactive to light. EOM are normal.  Neck: Trachea normal and normal range of motion. No tracheal deviation present.  Cardiovascular: Normal rate, regular rhythm and intact distal pulses.  Pulmonary/Chest: Effort normal. No respiratory distress.  Abdominal: Soft. There is no tenderness. There is no rebound and no guarding.  Musculoskeletal: Normal range of motion.  Neurological: He is alert. He has  normal strength. No sensory deficit. GCS eye subscore is 4. GCS verbal subscore is 5. GCS motor subscore is 6.  Speech is clear and goal oriented, follows commands Major Cranial nerves without deficit, no facial droop Normal strength in upper and lower extremities bilaterally including dorsiflexion and plantar flexion, strong and equal grip strength Sensation normal to light touch Moves extremities without ataxia, coordination  intact Normal gait  Skin: Skin is warm and dry. Capillary refill takes less than 2 seconds. No erythema.     Patient with multiple healing lacerations to his right upper extremity.  Several scabs in place however no drainage, erythema, increased warmth or pain to these areas.  Patient has full range of motion and strength to all joints without pain.  Psychiatric: He has a normal mood and affect. His behavior is normal.    ED Treatments / Results  Labs (all labs ordered are listed, but only abnormal results are displayed) Labs Reviewed - No data to display  EKG None  Radiology Dg Elbow 2 Views Right  Result Date: 05/17/2018 CLINICAL DATA:  Patient is here to have staples removed from elbow,wrist and hand on right side and staple count was incorrect form amount of staples put in. Encounter for foreign body(staples) EXAM: RIGHT ELBOW - 2 VIEW COMPARISON:  None. FINDINGS: No fracture.  No bone lesion. Elbow joint is normally spaced and aligned. No joint effusion. No staples or other radiopaque foreign body. IMPRESSION: 1. No retained surgical staple or other radiopaque foreign body. 2. No fracture or elbow joint abnormality. Electronically Signed   By: Amie Portland M.D.   On: 05/17/2018 14:51   Dg Wrist Complete Right  Result Date: 05/17/2018 CLINICAL DATA:  Patient is here to have staples removed from elbow,wrist and hand on right side and staple count was incorrect form amount of staples put in. Encounter for foreign body(staples) EXAM: RIGHT WRIST - COMPLETE 3+ VIEW COMPARISON:  None. FINDINGS: No fracture or bone lesion. Wrist joints are normally spaced and aligned. No arthropathic changes. No radiopaque foreign body or surgical staple. IMPRESSION: 1. No surgical staple or other radiopaque foreign body. 2. No fracture or joint abnormality. Electronically Signed   By: Amie Portland M.D.   On: 05/17/2018 14:53   Dg Finger Index Right  Result Date: 05/17/2018 CLINICAL DATA:  Patient is  here to have staples removed from elbow,wrist and hand on right side and staple count was incorrect form amount of staples put in. Encounter for foreign body(staples) EXAM: RIGHT INDEX FINGER 2+V COMPARISON:  None. FINDINGS: No fracture or bone lesion.  Joints are normally spaced and aligned. No radiopaque foreign body or surgical staple. IMPRESSION: 1. No surgical staple radiopaque foreign body. 2. No fracture or joint abnormality. Electronically Signed   By: Amie Portland M.D.   On: 05/17/2018 14:52    Procedures .Suture Removal Date/Time: 05/17/2018 3:21 PM Performed by: Bill Salinas, PA-C Authorized by: Bill Salinas, PA-C   Consent:    Consent obtained:  Verbal   Consent given by:  Patient   Risks discussed:  Bleeding, pain and wound separation Location:    Location:  Upper extremity   Upper extremity location:  Arm   Arm location:  R upper arm Procedure details:    Wound appearance:  No signs of infection   Number of sutures removed:  2   Number of staples removed:  6 Post-procedure details:    Post-removal:  Antibiotic ointment applied   Patient tolerance of procedure:  Tolerated well, no immediate complications   (including critical care time)  Medications Ordered in ED Medications  bacitracin ointment (has no administration in time range)     Initial Impression / Assessment and Plan / ED Course  I have reviewed the triage vital signs and the nursing notes.  Pertinent labs & imaging results that were available during my care of the patient were reviewed by me and considered in my medical decision making (see chart for details).  Clinical Course as of May 17 1522  Wynelle Link May 17, 2018  1348 6 staples and 1 suture   [BM]    Clinical Course User Index [BM] Bill Salinas, PA-C   Patient presents to ER for staple/suture  removal and wound check as above. Procedure tolerated well. Patient is afebrile, vitals normal, no signs of infection. Scar minimization  & return precautions given at discharge.  Patient given antibiotic ointments and sterile dressing prior to discharge.  Imaging was performed today because patient has been picking out some of his sutures and staples and there was a question to whether staple was left underneath the scabs.  Imaging today negative.  Patient states that he now wants to return to [facility] for rehabilitation.  Patient alert and oriented x3, no acute distress and well-appearing.  At this time there does not appear to be any evidence of an acute emergency medical condition and the patient appears stable for discharge with appropriate outpatient follow up. Diagnosis was discussed with patient who verbalizes understanding of care plan and is agreeable to discharge. I have discussed return precautions with patient who verbalizes understanding of return precautions. Patient strongly encouraged to follow-up with their PCP. All questions answered.  Patient has been seen and evaluated by Dr. Effie Shy who agrees with plan of care at this time.  Note: Portions of this report may have been transcribed using voice recognition software. Every effort was made to ensure accuracy; however, inadvertent computerized transcription errors may still be present.  Final Clinical Impressions(s) / ED Diagnoses   Final diagnoses:  Removal of staples  Encounter for removal of sutures    ED Discharge Orders    None       Elizabeth Palau 05/17/18 1527    Mancel Bale, MD 05/17/18 2148

## 2018-05-17 NOTE — ED Triage Notes (Signed)
Pt here to have staples removed from right arm, were placed on 10/4. Pt is also needing placement drug/alcholol use. Had placement in [place] but they would not accept  Him due to the wounds. Calm and cooperative at triage. Denies SI or HI.

## 2018-05-17 NOTE — ED Provider Notes (Signed)
  Face-to-face evaluation   History: Patient here for help with getting to rehab.  He is contact the facility in [facility] who will admit him that he can get there.  He plans on taking the bus there tonight at 9 PM.  Needed to have staples removed from the head wound.  This is been done.  Physical exam:, Calm, somewhat tearful.  No dysarthria or aphasia.  Medical screening examination/treatment/procedure(s) were conducted as a shared visit with non-physician practitioner(s) and myself.  I personally evaluated the patient during the encounter    Mancel Bale, MD 05/17/18 2148

## 2018-05-17 NOTE — ED Notes (Signed)
Patient verbalizes understanding of discharge instructions. Opportunity for questioning and answers were provided. Ambulatory at discharge in NAD.  

## 2018-09-17 ENCOUNTER — Inpatient Hospital Stay
Admit: 2018-09-17 | Discharge: 2018-09-17 | Disposition: A | Discharge status: Home / Self Care | Payer: Charity | Attending: Emergency Medicine

## 2018-09-17 DIAGNOSIS — F1910 Other psychoactive substance abuse, uncomplicated: Secondary | ICD-10-CM

## 2018-09-17 LAB — EKG 12-LEAD
Atrial Rate: 76 {beats}/min
Diagnosis: NORMAL
P Axis: 70 degrees
P-R Interval: 160 ms
Q-T Interval: 396 ms
QRS Duration: 94 ms
QTc Calculation (Bazett): 445 ms
R Axis: 39 degrees
T Axis: 21 degrees
Ventricular Rate: 76 {beats}/min

## 2018-09-17 LAB — ETHYL ALCOHOL
ALCOHOL(ETHYL),SERUM: 3 MG/DL
Ethyl Alcohol: 3 MG/DL

## 2018-09-17 LAB — DRUG SCREEN, URINE
AMPHETAMINES: POSITIVE
Amphetamine Screen, Urine: POSITIVE
BARBITURATES: NEGATIVE
BENZODIAZEPINES: NEGATIVE
Barbiturate Screen, Urine: NEGATIVE
Benzodiazepine Screen, Urine: NEGATIVE
COCAINE: POSITIVE
Cocaine Screen Urine: POSITIVE
METHADONE: NEGATIVE
Methadone Screen, Urine: NEGATIVE
OPIATES: NEGATIVE
Opiate Screen, Urine: NEGATIVE
PCP Screen, Urine: NEGATIVE
PCP(PHENCYCLIDINE): NEGATIVE
THC (TH-CANNABINOL): POSITIVE
THC Screen, Urine: POSITIVE

## 2018-09-17 LAB — EKG, 12 LEAD, INITIAL
Atrial Rate: 76 {beats}/min
Calculated P Axis: 70 degrees
Calculated R Axis: 39 degrees
Calculated T Axis: 21 degrees
Diagnosis: NORMAL
P-R Interval: 160 ms
Q-T Interval: 396 ms
QRS Duration: 94 ms
QTC Calculation (Bezet): 445 ms
Ventricular Rate: 76 {beats}/min

## 2018-09-17 MED ORDER — ONDANSETRON 4 MG TAB, RAPID DISSOLVE
4 mg | ORAL | Status: AC
Start: 2018-09-17 — End: 2018-09-17
  Administered 2018-09-17: 08:00:00 via ORAL

## 2018-09-17 MED ORDER — ACETAMINOPHEN 500 MG TAB
500 mg | ORAL | Status: AC
Start: 2018-09-17 — End: 2018-09-17
  Administered 2018-09-17: 08:00:00 via ORAL

## 2018-09-17 MED FILL — MAPAP EXTRA STRENGTH 500 MG TABLET: 500 mg | ORAL | Qty: 2

## 2018-09-17 MED FILL — ONDANSETRON 4 MG TAB, RAPID DISSOLVE: 4 mg | ORAL | Qty: 1

## 2018-09-17 NOTE — ED Notes (Signed)
Pt went to parking lot prior to DC vitals and receiving paperwork.

## 2018-09-17 NOTE — ED Notes (Signed)
 Arrives from hotel via [facility] with reports generalized body aches. Reports crack use approx 1 hour pta, onset of feeling terrible since use. States possible laced with fentanyl. +etoh. Has been at [facility] for 70 days, supposed to stay 90. Per ems arrives from [facility] at [address]. Pt from [place]

## 2018-09-17 NOTE — ED Notes (Signed)
Report to Ashley rn.

## 2018-09-17 NOTE — ED Notes (Signed)
Patient awaiting [facility] for acceptance

## 2018-09-17 NOTE — Progress Notes (Signed)
Contacted [employer] to transport patient to [facility] per request.

## 2018-09-17 NOTE — Progress Notes (Signed)
Per [facility], patient does not meet criteria for inpatient detox but does meet for outpatient detox.

## 2018-09-17 NOTE — ED Notes (Signed)
Pt given meal tray. [facility] to return to see patient and social work meeting with patient to assist with inpatient placement for drug rehabulation

## 2018-09-17 NOTE — ED Notes (Signed)
 I have reviewed discharge instructions with the patient.  The patient verbalized understanding.    Patient left ED via Discharge Method: ambulatory to Home with taxi. Marland Kitchen    Opportunity for questions and clarification provided.       Patient given 0 scripts.         To continue your aftercare when you leave the hospital, you may receive an automated call from our care team to check in on how you are doing.  This is a free service and part of our promise to provide the best care and service to meet your aftercare needs." If you have questions, or wish to unsubscribe from this service please call [phone].  Thank you for Choosing our [facility] Emergency Department.

## 2018-09-17 NOTE — ED Provider Notes (Signed)
43 year old male presents after smoking crack cocaine about an hour and a half ago and drinking alcohol.  He states he relapsed 2 days ago.  He was clean for 70 days.  He is from [facility] in [facility] [place] [facility].  He states he feels nauseous, like his heart is racing, and pain all over with a headache. he has sweats without documented fevers.  No actual vomiting or diarrhea.  He is requesting to go to detox.           No past medical history on file.    No past surgical history on file.      No family history on file.    Social History     Socioeconomic History   ??? Marital status: Not on file     Spouse name: Not on file   ??? Number of children: Not on file   ??? Years of education: Not on file   ??? Highest education level: Not on file   Occupational History   ??? Not on file   Social Needs   ??? Financial resource strain: Not on file   ??? Food insecurity:     Worry: Not on file     Inability: Not on file   ??? Transportation needs:     Medical: Not on file     Non-medical: Not on file   Tobacco Use   ??? Smoking status: Not on file   Substance and Sexual Activity   ??? Alcohol use: Not on file   ??? Drug use: Not on file   ??? Sexual activity: Not on file   Lifestyle   ??? Physical activity:     Days per week: Not on file     Minutes per session: Not on file   ??? Stress: Not on file   Relationships   ??? Social connections:     Talks on phone: Not on file     Gets together: Not on file     Attends religious service: Not on file     Active member of club or organization: Not on file     Attends meetings of clubs or organizations: Not on file     Relationship status: Not on file   ??? Intimate partner violence:     Fear of current or ex partner: Not on file     Emotionally abused: Not on file     Physically abused: Not on file     Forced sexual activity: Not on file   Other Topics Concern   ??? Not on file   Social History Narrative   ??? Not on file         ALLERGIES: Patient has no known allergies.    Review of Systems    Constitutional: Positive for fatigue. Negative for chills and fever.   HENT: Negative for hearing loss.    Eyes: Negative for visual disturbance.   Respiratory: Negative for cough and shortness of breath.    Cardiovascular: Positive for palpitations. Negative for chest pain.   Gastrointestinal: Positive for nausea. Negative for abdominal pain, diarrhea and vomiting.   Musculoskeletal: Positive for arthralgias and myalgias. Negative for back pain.   Skin: Negative for rash.   Neurological: Positive for headaches. Negative for weakness.   Psychiatric/Behavioral: Negative for confusion.       Vitals:    09/17/18 0240   BP: 127/73   Pulse: 80   Resp: 18   Temp: 97.4 ??F (36.3 ??C)  SpO2: 99%   Weight: 100.2 kg (221 lb)   Height: 6\' 2"  (1.88 m)            Physical Exam  Vitals signs and nursing note reviewed.   Constitutional:       Appearance: He is well-developed.   HENT:      Head: Normocephalic and atraumatic.   Eyes:      Pupils: Pupils are equal, round, and reactive to light.   Neck:      Musculoskeletal: Normal range of motion and neck supple.   Cardiovascular:      Rate and Rhythm: Regular rhythm.      Heart sounds: Normal heart sounds.   Pulmonary:      Effort: Pulmonary effort is normal.      Breath sounds: Normal breath sounds.   Abdominal:      Palpations: Abdomen is soft.      Tenderness: There is no abdominal tenderness.   Musculoskeletal: Normal range of motion.   Skin:     General: Skin is warm and dry.   Neurological:      Mental Status: He is alert.   Psychiatric:         Speech: Speech is slurred.         Behavior: Behavior is slowed.          MDM  Number of Diagnoses or Management Options  Diagnosis management comments: Parts of this document were created using dragon voice recognition software. The chart has been reviewed but errors may still be present.    Will give Tylenol and Zofran.  Contacted [facility].  Does not believe he requires further work-up at this time.  Vital signs stable.       Amount  and/or Complexity of Data Reviewed  Tests in the medicine section of CPT??: ordered and reviewed           Procedures

## 2018-09-17 NOTE — Progress Notes (Signed)
Nadine Counts with [facility] speaking with patient in room.

## 2018-09-17 NOTE — ED Notes (Signed)
Pt given meal tray. [facility] to return to see patient and social work meeting with patient to assist with inpatient placement for drug rehabulation

## 2018-09-17 NOTE — Progress Notes (Signed)
Contacted [employer] to transport patient to [facility] per request.

## 2018-09-17 NOTE — ED Notes (Signed)
Pt went to parking lot prior to DC vitals and receiving paperwork.

## 2018-09-17 NOTE — ED Triage Notes (Addendum)
Arrives from hotel via [facility] with reports generalized body aches. Reports crack use approx 1 hour pta, onset of "feeling terrible" since use. States possible "laced with fentanyl". +etoh. Has been at [facility] for "70 days", supposed to stay 90. Per ems arrives from [facility] at [address]. Pt from [place]

## 2018-09-17 NOTE — ED Notes (Signed)
I have reviewed discharge instructions with the patient.  The patient verbalized understanding.    Patient left ED via Discharge Method: ambulatory to Home with taxi.    Opportunity for questions and clarification provided.       Patient given 0 scripts.         To continue your aftercare when you leave the hospital, you may receive an automated call from our care team to check in on how you are doing.  This is a free service and part of our promise to provide the best care and service to meet your aftercare needs.??? If you have questions, or wish to unsubscribe from this service please call [phone].  Thank you for Choosing our [place] Emergency Department.

## 2018-09-17 NOTE — ED Notes (Signed)
Report to Ashley rn

## 2018-09-17 NOTE — Progress Notes (Signed)
Per [facility] patient does not meet criteria for inpatient detox but does meet for outpatient detox.

## 2018-09-17 NOTE — ED Provider Notes (Signed)
43 year old male presents after smoking crack cocaine about an hour and a half ago and drinking alcohol.  He states he relapsed 2 days ago.  He was clean for 70 days.  He is from [facility] in [place] [facility].  He states he feels nauseous, like his heart is racing, and pain all over with a headache. he has sweats without documented fevers.  No actual vomiting or diarrhea.  He is requesting to go to detox.           No past medical history on file.    No past surgical history on file.      No family history on file.    Social History     Socioeconomic History   ??? Marital status: Not on file     Spouse name: Not on file   ??? Number of children: Not on file   ??? Years of education: Not on file   ??? Highest education level: Not on file   Occupational History   ??? Not on file   Social Needs   ??? Financial resource strain: Not on file   ??? Food insecurity:     Worry: Not on file     Inability: Not on file   ??? Transportation needs:     Medical: Not on file     Non-medical: Not on file   Tobacco Use   ??? Smoking status: Not on file   Substance and Sexual Activity   ??? Alcohol use: Not on file   ??? Drug use: Not on file   ??? Sexual activity: Not on file   Lifestyle   ??? Physical activity:     Days per week: Not on file     Minutes per session: Not on file   ??? Stress: Not on file   Relationships   ??? Social connections:     Talks on phone: Not on file     Gets together: Not on file     Attends religious service: Not on file     Active member of club or organization: Not on file     Attends meetings of clubs or organizations: Not on file     Relationship status: Not on file   ??? Intimate partner violence:     Fear of current or ex partner: Not on file     Emotionally abused: Not on file     Physically abused: Not on file     Forced sexual activity: Not on file   Other Topics Concern   ??? Not on file   Social History Narrative   ??? Not on file         ALLERGIES: Patient has no known allergies.    Review of Systems    Constitutional: Positive for fatigue. Negative for chills and fever.   HENT: Negative for hearing loss.    Eyes: Negative for visual disturbance.   Respiratory: Negative for cough and shortness of breath.    Cardiovascular: Positive for palpitations. Negative for chest pain.   Gastrointestinal: Positive for nausea. Negative for abdominal pain, diarrhea and vomiting.   Musculoskeletal: Positive for arthralgias and myalgias. Negative for back pain.   Skin: Negative for rash.   Neurological: Positive for headaches. Negative for weakness.   Psychiatric/Behavioral: Negative for confusion.       Vitals:    09/17/18 0240   BP: 127/73   Pulse: 80   Resp: 18   Temp: 97.4 ??F (36.3 ??C)  SpO2: 99%   Weight: 100.2 kg (221 lb)   Height: 6\' 2"  (1.88 m)            Physical Exam  Vitals signs and nursing note reviewed.   Constitutional:       Appearance: He is well-developed.   HENT:      Head: Normocephalic and atraumatic.   Eyes:      Pupils: Pupils are equal, round, and reactive to light.   Neck:      Musculoskeletal: Normal range of motion and neck supple.   Cardiovascular:      Rate and Rhythm: Regular rhythm.      Heart sounds: Normal heart sounds.   Pulmonary:      Effort: Pulmonary effort is normal.      Breath sounds: Normal breath sounds.   Abdominal:      Palpations: Abdomen is soft.      Tenderness: There is no abdominal tenderness.   Musculoskeletal: Normal range of motion.   Skin:     General: Skin is warm and dry.   Neurological:      Mental Status: He is alert.   Psychiatric:         Speech: Speech is slurred.         Behavior: Behavior is slowed.          MDM  Number of Diagnoses or Management Options  Diagnosis management comments: Parts of this document were created using dragon voice recognition software. The chart has been reviewed but errors may still be present.    Will give Tylenol and Zofran.  Contacted [facility].  Does not believe he requires further work-up at this time.  Vital signs stable.        Amount and/or Complexity of Data Reviewed  Tests in the medicine section of CPT??: ordered and reviewed           Procedures

## 2019-06-08 IMAGING — DX DG CHEST 1V PORT
1 series · 1 of 1 positions shown · non-contrast
Comparison: None.

CLINICAL DATA: Intubation.

EXAM:
PORTABLE CHEST 1 VIEW

[chest]
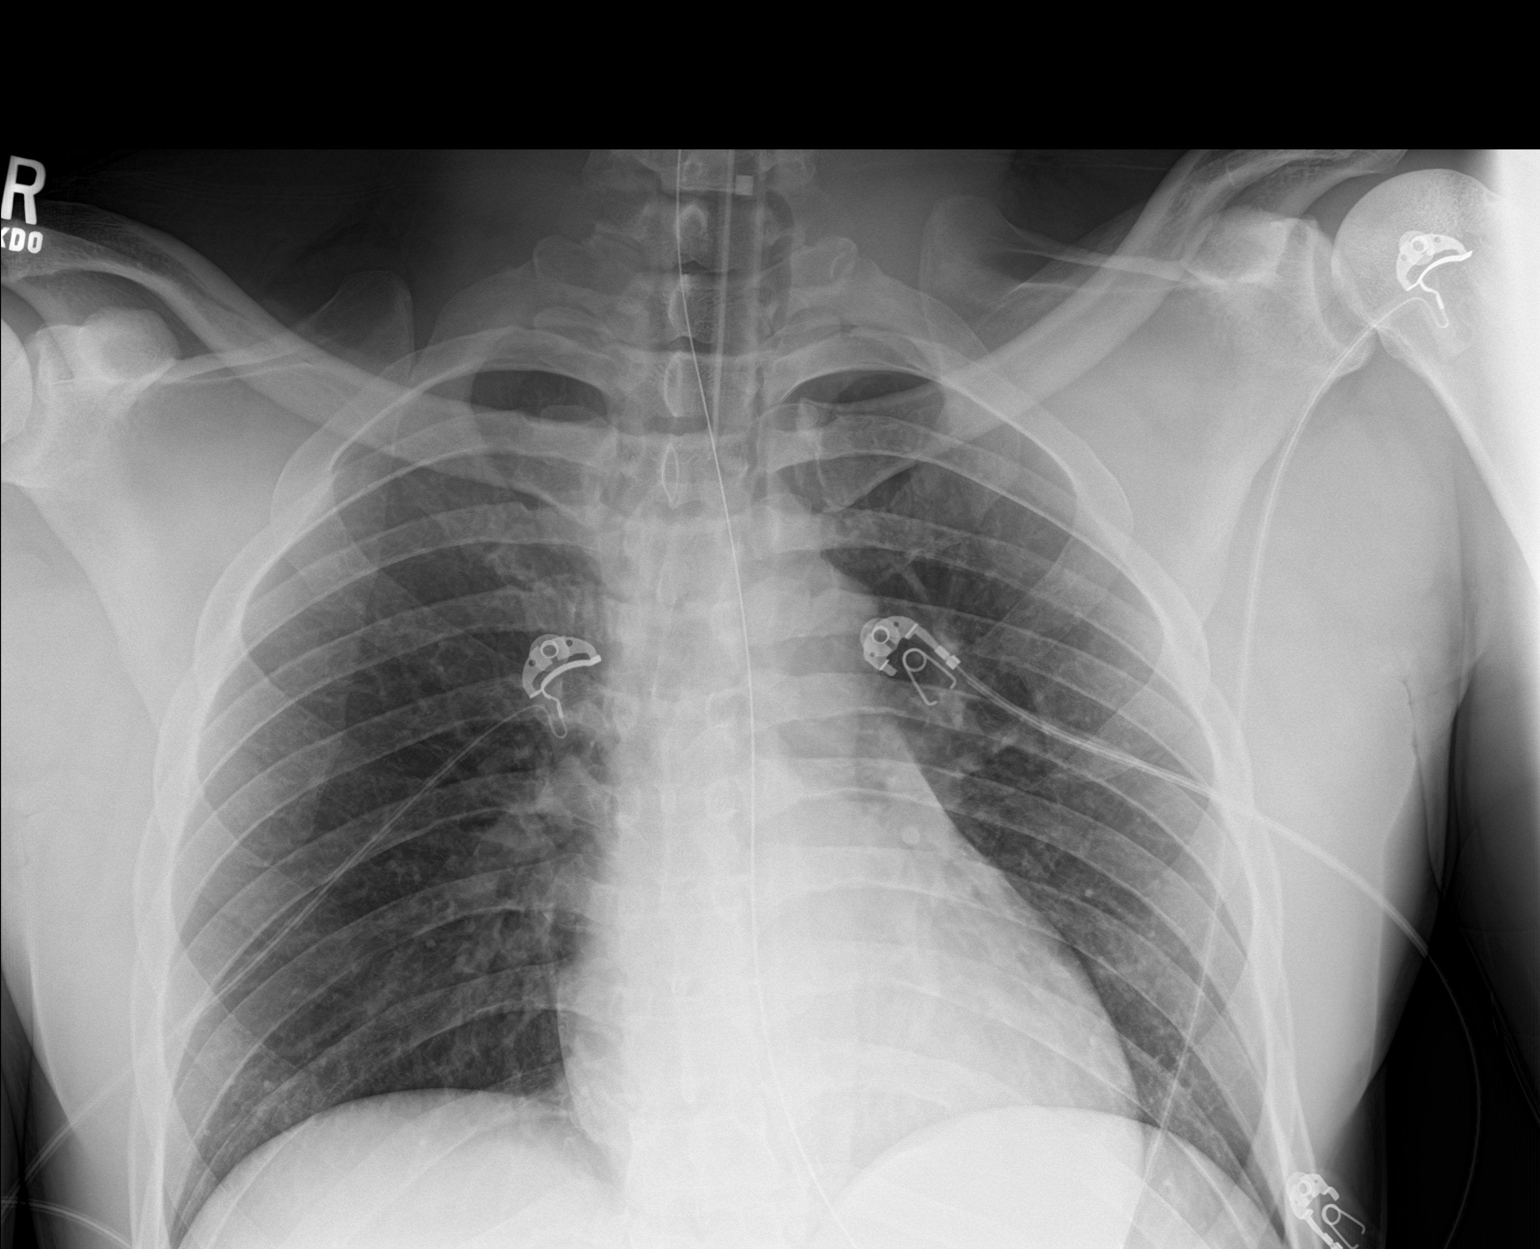

[1 of 1 positions shown; findings below may reference images not displayed]

FINDINGS: Endotracheal tube is 6 cm from the carina at the thoracic inlet.
Enteric tube in place tip below the diaphragm, not included in the
field of view. Lung volumes are low. Normal heart size and
mediastinal contours. No consolidation, pleural fluid or
pneumothorax. No acute osseous abnormalities.
IMPRESSION: 1. Endotracheal tube tip at the thoracic inlet. Enteric tube in
place with tip below the diaphragm.
2. Hypo aerated but clear lungs.

## 2019-06-08 IMAGING — CT CT HEAD W/O CM
4 series · 16 of 47 positions shown, 18 images · non-contrast
Comparison: None.

CLINICAL DATA: Altered level of consciousness (LOC), unexplained

EXAM:
CT HEAD WITHOUT CONTRAST
TECHNIQUE: Contiguous axial images were obtained from the base of the skull
through the vertex without intravenous contrast.

[Series 3: head wo · axial · 0.46mm/px · z∈[-132,-12]mm · 7 of 34 slices shown, 9 images]
[im 5/34  brain]
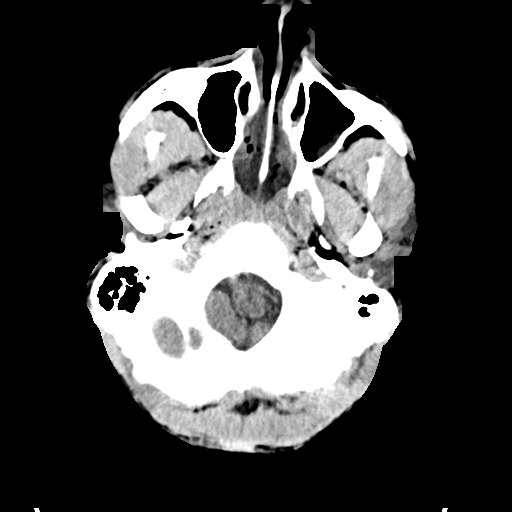
[im 5/34  bone]
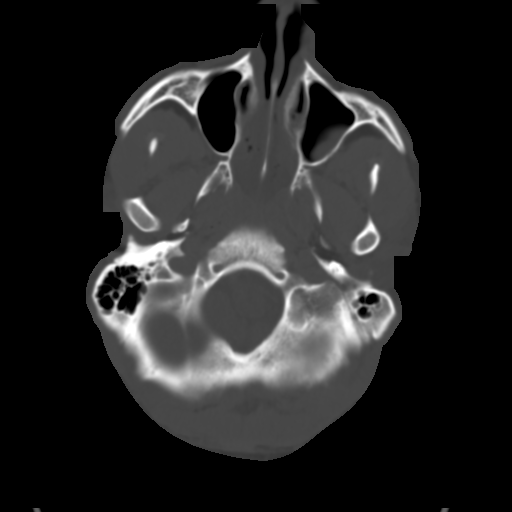
[im 9/34  brain]
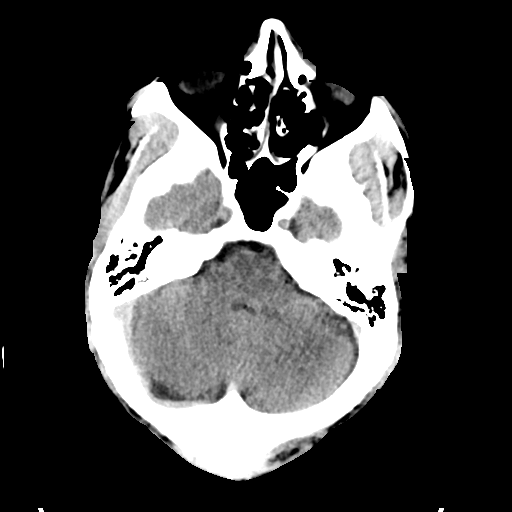
[im 13/34  brain]
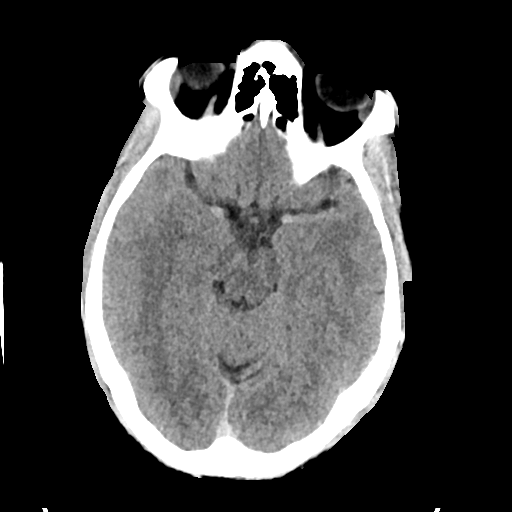
[im 17/34  brain]
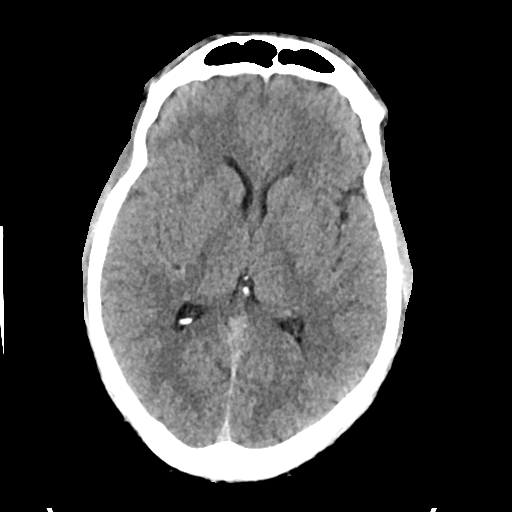
[im 21/34  brain]
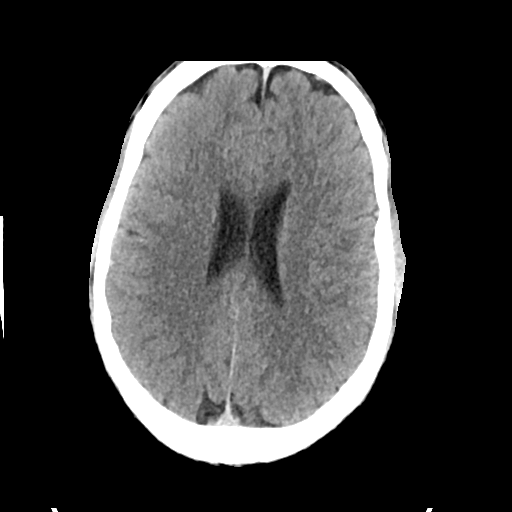
[im 21/34  bone]
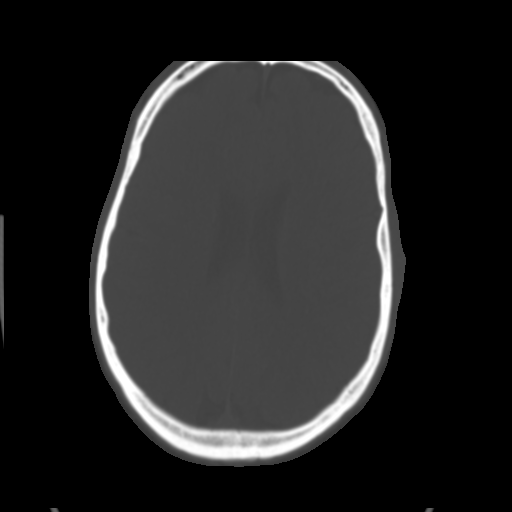
[im 25/34  brain]
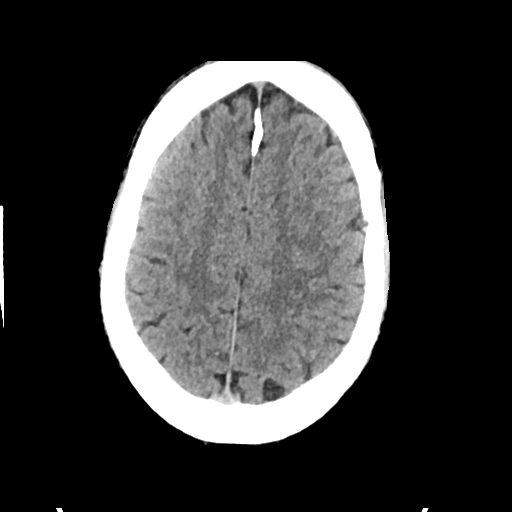
[im 29/34  brain]
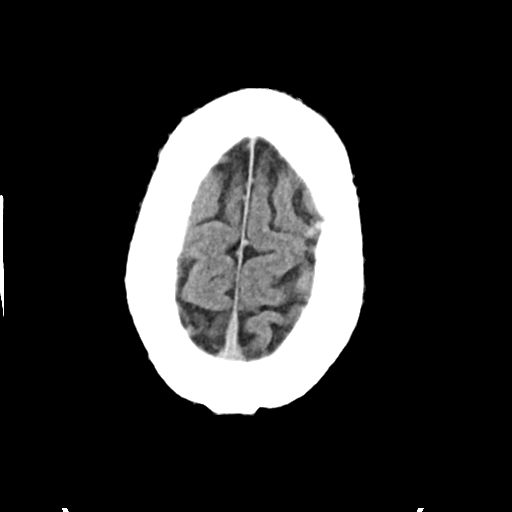

[Series 4: head bone · axial · 0.46mm/px · z∈[-136,-102]mm · 3 of 85 slices shown]
[im 9/85  bone]
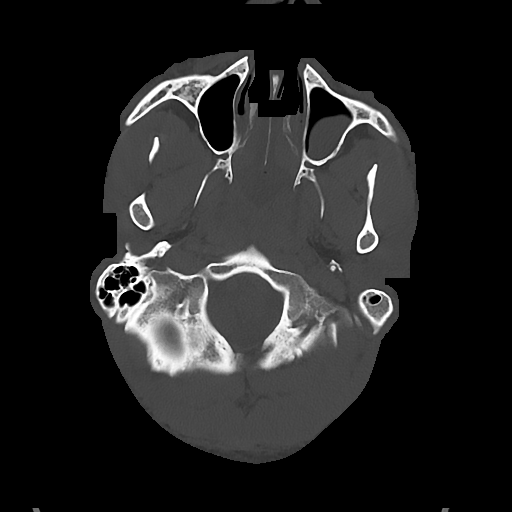
[im 17/85  bone]
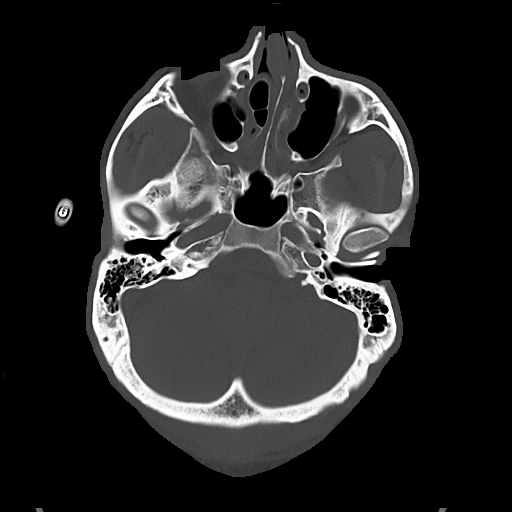
[im 26/85  bone]
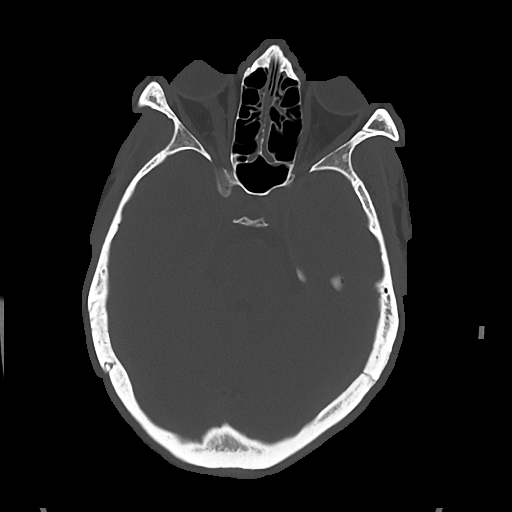

[Series 5: cor soft · coronal · 0.33mm/px · 3 of 76 slices shown]
[im 26/76  brain]
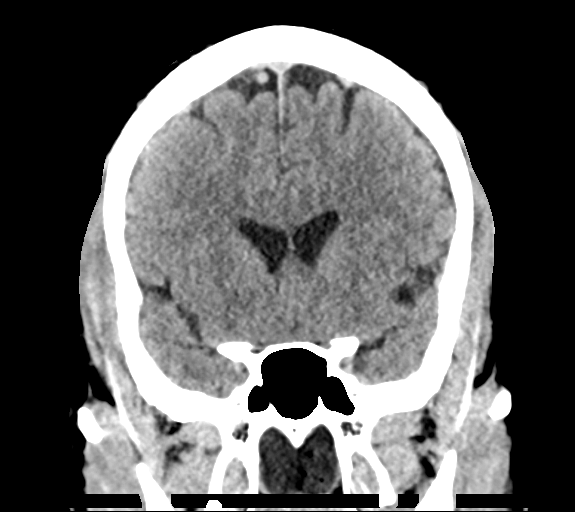
[im 34/76  brain]
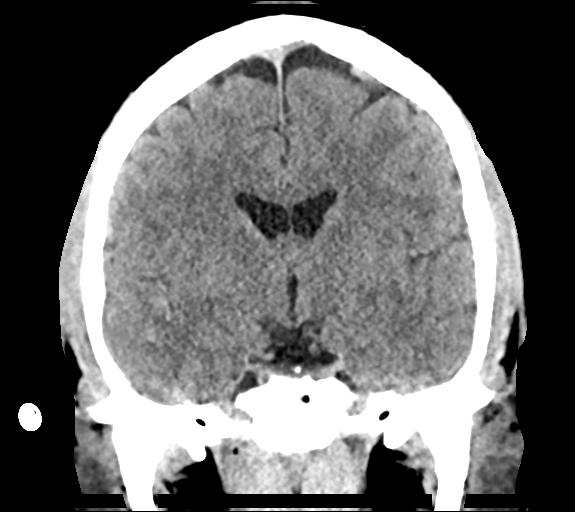
[im 42/76  brain]
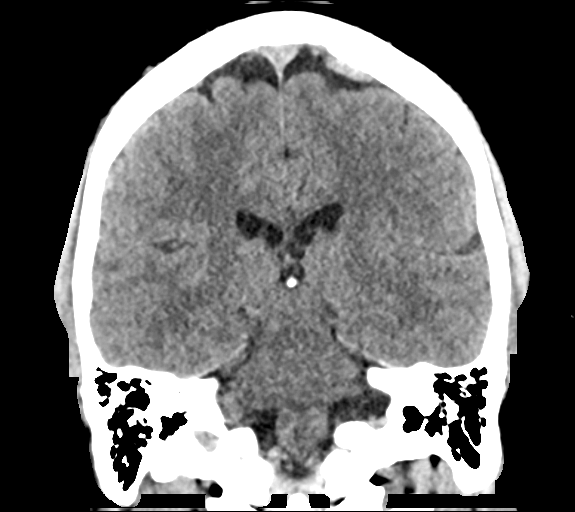

[Series 6: sag soft · sagittal · 0.33mm/px · 3 of 56 slices shown]
[im 19/56  brain]
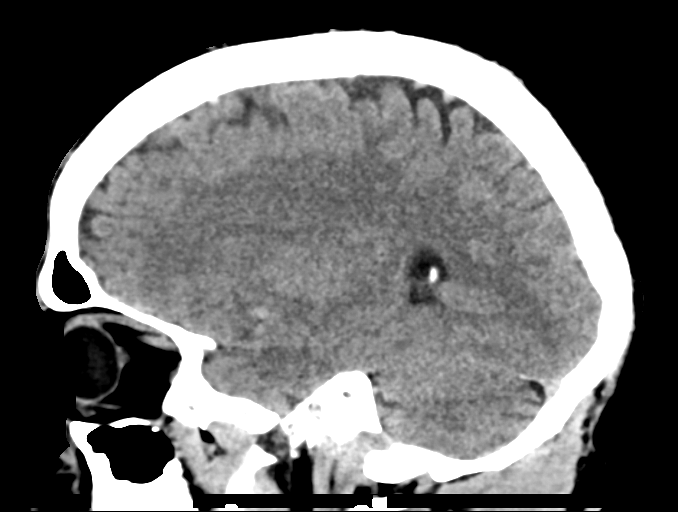
[im 28/56  brain]
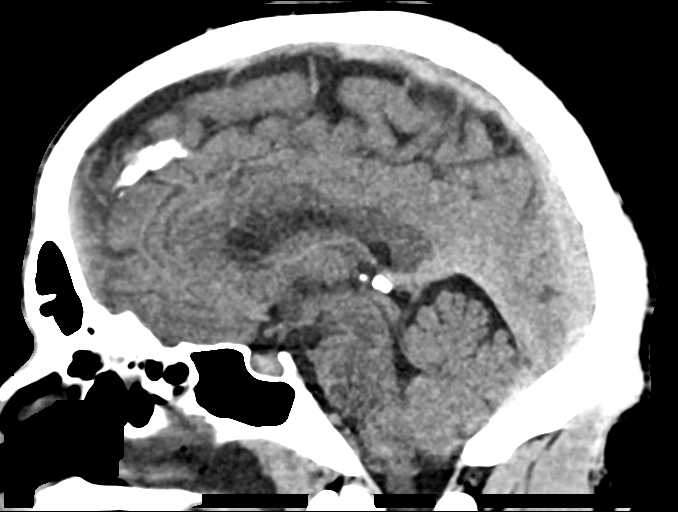
[im 37/56  brain]
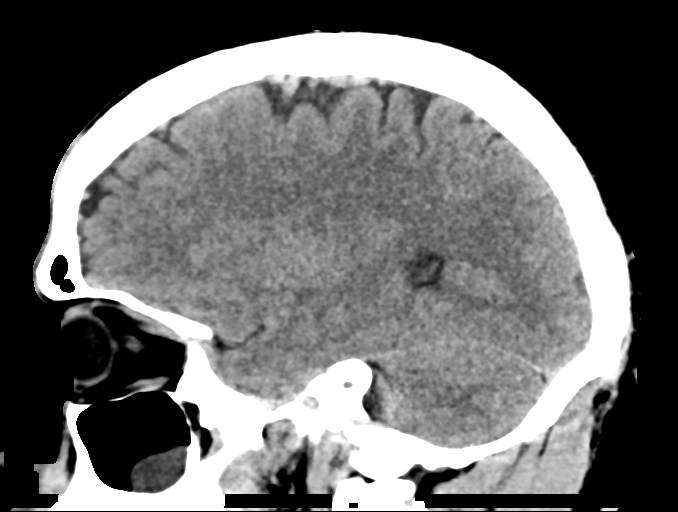

[16 of 47 positions shown; findings below may reference images not displayed]

FINDINGS: Brain: No intracranial hemorrhage, mass effect, or midline shift. No
evidence of cerebral edema. No hydrocephalus. The basilar cisterns
are patent. No evidence of territorial infarct or acute ischemia. No
extra-axial or intracranial fluid collection.

Vascular: Generalized increased density of intracranial vasculature
without hyperdense vessel.

Skull: No fracture or focal lesion.

Sinuses/Orbits: Mucosal thickening of the ethmoid air cells may be
due to intubation. Mucous retention cyst in the left maxillary
sinus. Orbits are unremarkable.

Other: None.
IMPRESSION: No acute intracranial abnormality.

## 2019-06-17 IMAGING — DX DG ELBOW 2V*R*
2 series · 2 of 2 positions shown · non-contrast
Comparison: None.

CLINICAL DATA: Patient is here to have staples removed from
elbow,wrist and hand on right side and staple count was incorrect
form amount of staples put in. Encounter for foreign body(staples)

EXAM:
RIGHT ELBOW - 2 VIEW

[elbow ap]
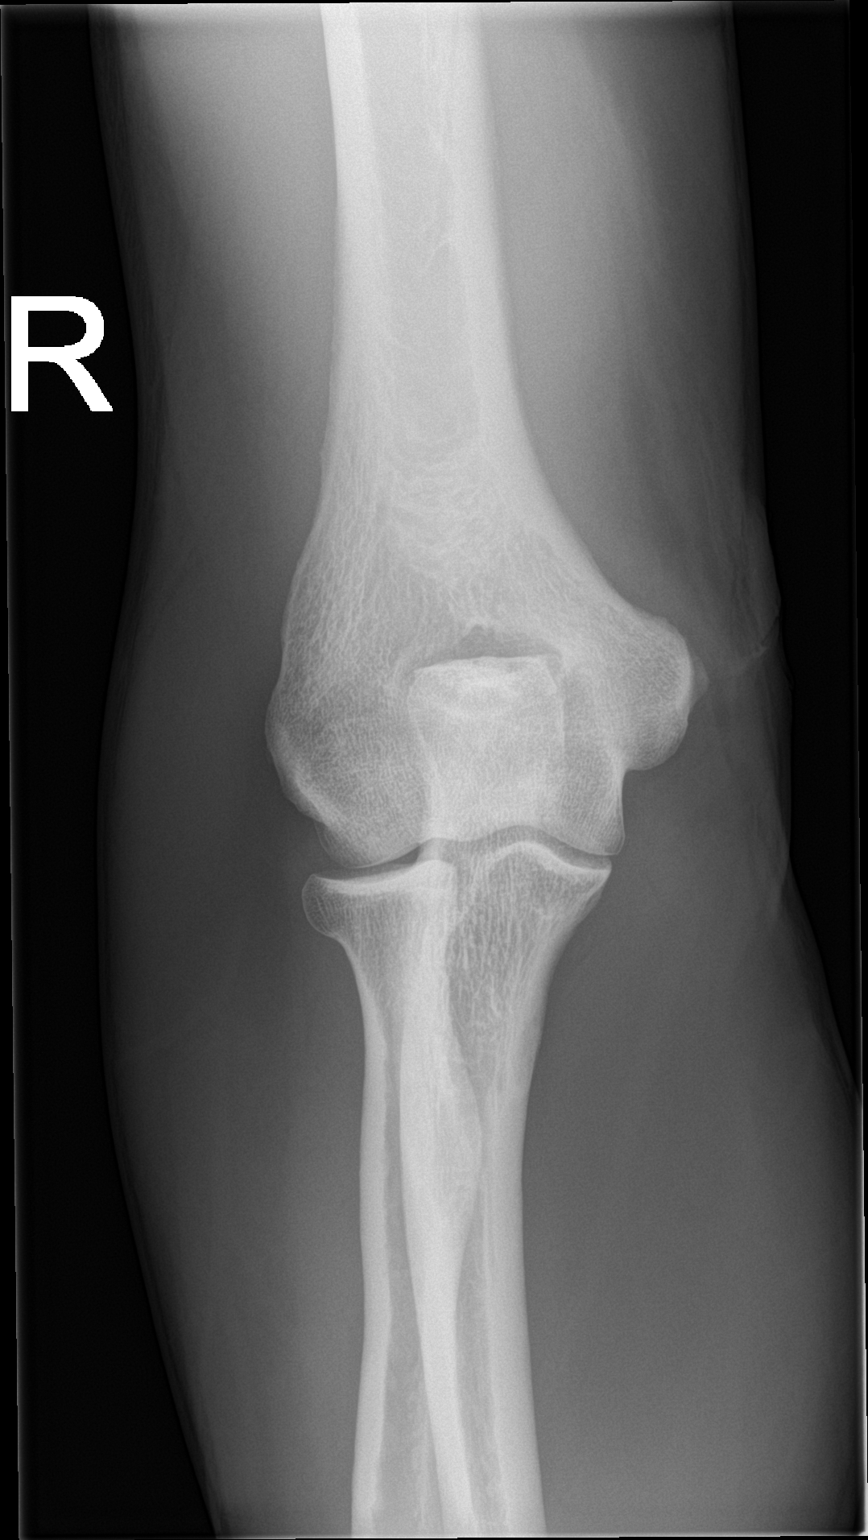

[elbow lat]
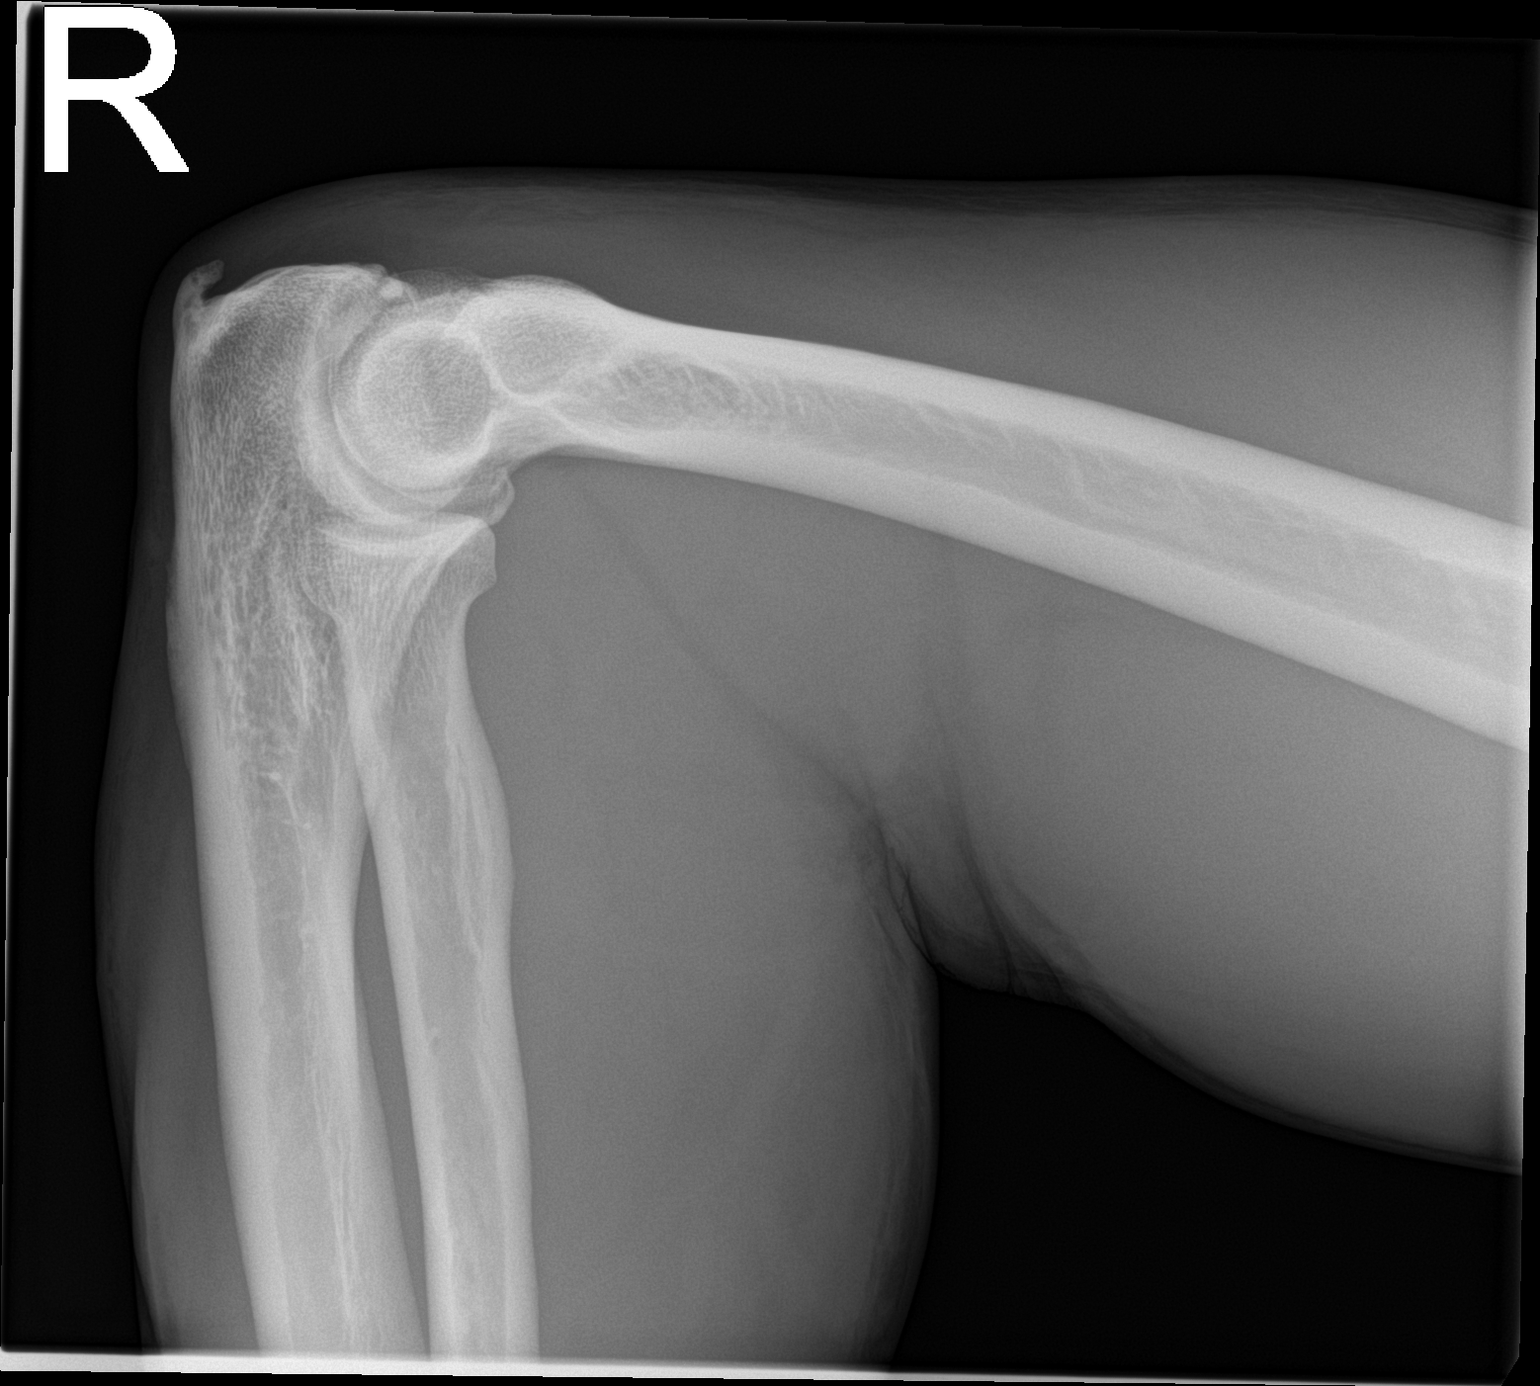

[2 of 2 positions shown; findings below may reference images not displayed]

FINDINGS: No fracture.  No bone lesion.

Elbow joint is normally spaced and aligned.

No joint effusion.

No staples or other radiopaque foreign body.
IMPRESSION: 1. No retained surgical staple or other radiopaque foreign body.
2. No fracture or elbow joint abnormality.

## 2019-10-04 DIAGNOSIS — Z5321 Procedure and treatment not carried out due to patient leaving prior to being seen by health care provider: Secondary | ICD-10-CM

## 2019-10-04 NOTE — ED Notes (Signed)
No orders at this time per MD wetenhall

## 2019-10-04 NOTE — ED Notes (Signed)
 Pt [place] [facility] [place] c/o feels funny after taking weed, crack and ETOH about 45 mins ago. Pt states that he is hallucinating and needs a place to just chill out

## 2019-10-04 NOTE — ED Notes (Signed)
No orders at this time per MD wetenhall

## 2019-10-04 NOTE — ED Triage Notes (Signed)
Pt [place] [facility] [place] c/o "feels funny" after taking weed, crack and ETOH about 45 mins ago. Pt states that he is hallucinating and "needs a place to just chill out"

## 2019-10-05 ENCOUNTER — Inpatient Hospital Stay: Admit: 2019-10-05 | Discharge: 2019-10-05 | Attending: Emergency Medicine

## 2019-10-05 NOTE — ED Notes (Signed)
Unable to locate for room placement

## 2019-10-05 NOTE — ED Provider Notes (Signed)
Other         No past medical history on file.    No past surgical history on file.      No family history on file.    Social History     Socioeconomic History   ??? Marital status: SINGLE     Spouse name: Not on file   ??? Number of children: Not on file   ??? Years of education: Not on file   ??? Highest education level: Not on file   Occupational History   ??? Not on file   Social Needs   ??? Financial resource strain: Not on file   ??? Food insecurity     Worry: Not on file     Inability: Not on file   ??? Transportation needs     Medical: Not on file     Non-medical: Not on file   Tobacco Use   ??? Smoking status: Not on file   Substance and Sexual Activity   ??? Alcohol use: Not on file   ??? Drug use: Not on file   ??? Sexual activity: Not on file   Lifestyle   ??? Physical activity     Days per week: Not on file     Minutes per session: Not on file   ??? Stress: Not on file   Relationships   ??? Social Wellsite geologist on phone: Not on file     Gets together: Not on file     Attends religious service: Not on file     Active member of club or organization: Not on file     Attends meetings of clubs or organizations: Not on file     Relationship status: Not on file   ??? Intimate partner violence     Fear of current or ex partner: Not on file     Emotionally abused: Not on file     Physically abused: Not on file     Forced sexual activity: Not on file   Other Topics Concern   ??? Not on file   Social History Narrative   ??? Not on file         ALLERGIES: Patient has no known allergies.    Review of Systems    Vitals:    10/04/19 2250   BP: (!) 145/97   Pulse: 97   Resp: 16   Temp: 98.6 ??F (37 ??C)   SpO2: 98%   Weight: 90.7 kg (200 lb)   Height: 6\' 2"  (1.88 m)            Physical Exam     MDM       Procedures

## 2019-10-05 NOTE — ED Provider Notes (Signed)
Other         No past medical history on file.    No past surgical history on file.      No family history on file.    Social History     Socioeconomic History   ??? Marital status: SINGLE     Spouse name: Not on file   ??? Number of children: Not on file   ??? Years of education: Not on file   ??? Highest education level: Not on file   Occupational History   ??? Not on file   Social Needs   ??? Financial resource strain: Not on file   ??? Food insecurity     Worry: Not on file     Inability: Not on file   ??? Transportation needs     Medical: Not on file     Non-medical: Not on file   Tobacco Use   ??? Smoking status: Not on file   Substance and Sexual Activity   ??? Alcohol use: Not on file   ??? Drug use: Not on file   ??? Sexual activity: Not on file   Lifestyle   ??? Physical activity     Days per week: Not on file     Minutes per session: Not on file   ??? Stress: Not on file   Relationships   ??? Social connections     Talks on phone: Not on file     Gets together: Not on file     Attends religious service: Not on file     Active member of club or organization: Not on file     Attends meetings of clubs or organizations: Not on file     Relationship status: Not on file   ??? Intimate partner violence     Fear of current or ex partner: Not on file     Emotionally abused: Not on file     Physically abused: Not on file     Forced sexual activity: Not on file   Other Topics Concern   ??? Not on file   Social History Narrative   ??? Not on file         ALLERGIES: Patient has no known allergies.    Review of Systems    Vitals:    10/04/19 2250   BP: (!) 145/97   Pulse: 97   Resp: 16   Temp: 98.6 ??F (37 ??C)   SpO2: 98%   Weight: 90.7 kg (200 lb)   Height: 6' 2" (1.88 m)            Physical Exam     MDM       Procedures
